# Patient Record
Sex: Female | Born: 1990 | Race: Black or African American | Hispanic: No | Marital: Single | Smoking: Never smoker
Health system: Southern US, Community
[De-identification: ages and names within clinical notes are randomized; demographics above are authoritative.]

## PROBLEM LIST (undated history)

## (undated) DIAGNOSIS — R87629 Unspecified abnormal cytological findings in specimens from vagina: Secondary | ICD-10-CM

## (undated) DIAGNOSIS — D649 Anemia, unspecified: Secondary | ICD-10-CM

## (undated) HISTORY — DX: Unspecified abnormal cytological findings in specimens from vagina: R87.629

---

## 2012-12-12 HISTORY — PX: CHOLECYSTECTOMY: SHX55

## 2015-12-24 ENCOUNTER — Ambulatory Visit: Payer: Self-pay | Admitting: Physician Assistant

## 2016-05-05 ENCOUNTER — Ambulatory Visit: Payer: Self-pay | Admitting: Gynecology

## 2017-04-26 ENCOUNTER — Encounter: Payer: Self-pay | Admitting: Gynecology

## 2017-10-16 DIAGNOSIS — G479 Sleep disorder, unspecified: Secondary | ICD-10-CM | POA: Diagnosis not present

## 2018-01-10 DIAGNOSIS — N760 Acute vaginitis: Secondary | ICD-10-CM | POA: Diagnosis not present

## 2018-01-10 DIAGNOSIS — B9689 Other specified bacterial agents as the cause of diseases classified elsewhere: Secondary | ICD-10-CM | POA: Diagnosis not present

## 2018-01-10 DIAGNOSIS — N390 Urinary tract infection, site not specified: Secondary | ICD-10-CM | POA: Diagnosis not present

## 2018-03-16 DIAGNOSIS — N898 Other specified noninflammatory disorders of vagina: Secondary | ICD-10-CM | POA: Diagnosis not present

## 2018-03-16 DIAGNOSIS — R0981 Nasal congestion: Secondary | ICD-10-CM | POA: Diagnosis not present

## 2018-03-16 DIAGNOSIS — J0100 Acute maxillary sinusitis, unspecified: Secondary | ICD-10-CM | POA: Diagnosis not present

## 2018-08-12 DIAGNOSIS — N390 Urinary tract infection, site not specified: Secondary | ICD-10-CM | POA: Diagnosis not present

## 2018-08-12 DIAGNOSIS — R319 Hematuria, unspecified: Secondary | ICD-10-CM | POA: Diagnosis not present

## 2018-08-12 DIAGNOSIS — R300 Dysuria: Secondary | ICD-10-CM | POA: Diagnosis not present

## 2018-08-12 DIAGNOSIS — R350 Frequency of micturition: Secondary | ICD-10-CM | POA: Diagnosis not present

## 2018-09-07 DIAGNOSIS — N390 Urinary tract infection, site not specified: Secondary | ICD-10-CM | POA: Diagnosis not present

## 2018-10-26 DIAGNOSIS — N390 Urinary tract infection, site not specified: Secondary | ICD-10-CM | POA: Diagnosis not present

## 2019-03-28 DIAGNOSIS — Z6841 Body Mass Index (BMI) 40.0 and over, adult: Secondary | ICD-10-CM | POA: Diagnosis not present

## 2019-03-28 DIAGNOSIS — N898 Other specified noninflammatory disorders of vagina: Secondary | ICD-10-CM | POA: Diagnosis not present

## 2019-04-03 DIAGNOSIS — N898 Other specified noninflammatory disorders of vagina: Secondary | ICD-10-CM | POA: Diagnosis not present

## 2019-04-23 DIAGNOSIS — Z309 Encounter for contraceptive management, unspecified: Secondary | ICD-10-CM | POA: Diagnosis not present

## 2019-04-23 DIAGNOSIS — Z1331 Encounter for screening for depression: Secondary | ICD-10-CM | POA: Diagnosis not present

## 2019-04-23 DIAGNOSIS — M26623 Arthralgia of bilateral temporomandibular joint: Secondary | ICD-10-CM | POA: Diagnosis not present

## 2019-06-14 DIAGNOSIS — K047 Periapical abscess without sinus: Secondary | ICD-10-CM | POA: Diagnosis not present

## 2019-06-19 DIAGNOSIS — Z6841 Body Mass Index (BMI) 40.0 and over, adult: Secondary | ICD-10-CM | POA: Diagnosis not present

## 2019-06-19 DIAGNOSIS — B373 Candidiasis of vulva and vagina: Secondary | ICD-10-CM | POA: Diagnosis not present

## 2019-06-27 DIAGNOSIS — N390 Urinary tract infection, site not specified: Secondary | ICD-10-CM | POA: Diagnosis not present

## 2019-06-27 DIAGNOSIS — Z6841 Body Mass Index (BMI) 40.0 and over, adult: Secondary | ICD-10-CM | POA: Diagnosis not present

## 2019-06-27 DIAGNOSIS — Z0000 Encounter for general adult medical examination without abnormal findings: Secondary | ICD-10-CM | POA: Diagnosis not present

## 2019-07-31 DIAGNOSIS — R87610 Atypical squamous cells of undetermined significance on cytologic smear of cervix (ASC-US): Secondary | ICD-10-CM | POA: Diagnosis not present

## 2019-07-31 DIAGNOSIS — N72 Inflammatory disease of cervix uteri: Secondary | ICD-10-CM | POA: Diagnosis not present

## 2019-07-31 DIAGNOSIS — N870 Mild cervical dysplasia: Secondary | ICD-10-CM | POA: Diagnosis not present

## 2019-07-31 DIAGNOSIS — R87810 Cervical high risk human papillomavirus (HPV) DNA test positive: Secondary | ICD-10-CM | POA: Diagnosis not present

## 2019-07-31 DIAGNOSIS — Z3202 Encounter for pregnancy test, result negative: Secondary | ICD-10-CM | POA: Diagnosis not present

## 2019-10-31 DIAGNOSIS — M26623 Arthralgia of bilateral temporomandibular joint: Secondary | ICD-10-CM | POA: Diagnosis not present

## 2019-10-31 DIAGNOSIS — K0889 Other specified disorders of teeth and supporting structures: Secondary | ICD-10-CM | POA: Diagnosis not present

## 2019-10-31 DIAGNOSIS — Z6841 Body Mass Index (BMI) 40.0 and over, adult: Secondary | ICD-10-CM | POA: Diagnosis not present

## 2019-11-12 DIAGNOSIS — Z20828 Contact with and (suspected) exposure to other viral communicable diseases: Secondary | ICD-10-CM | POA: Diagnosis not present

## 2019-11-12 DIAGNOSIS — Z7189 Other specified counseling: Secondary | ICD-10-CM | POA: Diagnosis not present

## 2019-11-15 DIAGNOSIS — Z20828 Contact with and (suspected) exposure to other viral communicable diseases: Secondary | ICD-10-CM | POA: Diagnosis not present

## 2019-11-29 DIAGNOSIS — Z113 Encounter for screening for infections with a predominantly sexual mode of transmission: Secondary | ICD-10-CM | POA: Diagnosis not present

## 2019-11-29 DIAGNOSIS — Z202 Contact with and (suspected) exposure to infections with a predominantly sexual mode of transmission: Secondary | ICD-10-CM | POA: Diagnosis not present

## 2019-11-29 DIAGNOSIS — Z6841 Body Mass Index (BMI) 40.0 and over, adult: Secondary | ICD-10-CM | POA: Diagnosis not present

## 2019-11-29 DIAGNOSIS — R87613 High grade squamous intraepithelial lesion on cytologic smear of cervix (HGSIL): Secondary | ICD-10-CM | POA: Diagnosis not present

## 2020-03-06 ENCOUNTER — Ambulatory Visit: Payer: Self-pay | Attending: Internal Medicine

## 2020-03-06 DIAGNOSIS — Z23 Encounter for immunization: Secondary | ICD-10-CM

## 2020-03-06 NOTE — Progress Notes (Signed)
   Covid-19 Vaccination Clinic  Name:  Megan Beasley    MRN: [id number] DOB: 13-Oct-1991  03/06/2020  [name] was observed post Covid-19 immunization for 15 minutes without incident. She was provided with Vaccine Information Sheet and instruction to access the V-Safe system.   [name] was instructed to call 911 with any severe reactions post vaccine: Marland Kitchen Difficulty breathing  . Swelling of face and throat  . A fast heartbeat  . A bad rash all over body  . Dizziness and weakness   Immunizations Administered    Name Date Dose VIS Date Route   Pfizer COVID-19 Vaccine 03/06/2020  8:30 AM 0.3 mL 11/22/2019 Intramuscular   Manufacturer: [employer], [employer]   Lot: [id number]   NDC: [address]-4

## 2020-03-30 ENCOUNTER — Ambulatory Visit: Payer: Self-pay | Attending: Internal Medicine

## 2020-03-30 DIAGNOSIS — Z23 Encounter for immunization: Secondary | ICD-10-CM

## 2020-03-30 NOTE — Progress Notes (Signed)
   Covid-19 Vaccination Clinic  Name:  Megan Beasley    MRN: [id number] DOB: Jul 20, 1991  03/30/2020  [name] was observed post Covid-19 immunization for 15 minutes without incident. She was provided with Vaccine Information Sheet and instruction to access the V-Safe system.   [name] was instructed to call 911 with any severe reactions post vaccine: Marland Kitchen Difficulty breathing  . Swelling of face and throat  . A fast heartbeat  . A bad rash all over body  . Dizziness and weakness   Immunizations Administered    Name Date Dose VIS Date Route   Pfizer COVID-19 Vaccine 03/30/2020  4:17 PM 0.3 mL 02/05/2019 Intramuscular   Manufacturer: [employer], [employer]   Lot: [id number]   NDC: [address]-1

## 2020-05-13 DIAGNOSIS — R6884 Jaw pain: Secondary | ICD-10-CM | POA: Diagnosis not present

## 2020-05-13 DIAGNOSIS — M26629 Arthralgia of temporomandibular joint, unspecified side: Secondary | ICD-10-CM | POA: Diagnosis not present

## 2020-05-13 DIAGNOSIS — Z9049 Acquired absence of other specified parts of digestive tract: Secondary | ICD-10-CM | POA: Diagnosis not present

## 2020-12-12 NOTE — L&D Delivery Note (Addendum)
Delivery Note At 5:13 PM a viable and healthy female was delivered via Vaginal, Spontaneous (Presentation: Right Occiput Anterior).  APGAR: 9, 9; weight pending .   Placenta status: Spontaneous, Intact.  Cord: 3 vessels with loose nuchal cord x 1  The patient pushed with 1 contraction and delivered a vigorous female infant in the vertex right occiput anterior presentation with Apgar scores of 9 at 1 minute and 9 at 5 minutes.  With delivery of the infant's head a loose nuchal cord was noted and reduced on the perineum prior to delivery of the body.  The infant was passed to the waiting maternal abdomen.  Following a 1 minute delay, the cord was clamped and cut.  The placenta delivered spontaneously, intact, with three-vessel cord.  No lacerations required repair.  Following delivery of the placenta the patient had some intermittent uterine atony that resolved with bimanual massage.  1000 mcg of misoprostol were placed rectally.  EBL 157 cc.  Mom and baby are doing well following delivery.  Anesthesia: Epidural Episiotomy: None Lacerations: None Suture Repair:  NA Est. Blood Loss (mL): 157  Mom to postpartum.  Baby to Couplet care / Skin to Skin.  Waynard Reeds 10/22/2021, 5:47 PM

## 2021-03-17 ENCOUNTER — Encounter: Payer: Self-pay | Admitting: Obstetrics & Gynecology

## 2021-04-15 LAB — OB RESULTS CONSOLE ABO/RH: RH Type: POSITIVE

## 2021-04-15 LAB — OB RESULTS CONSOLE GC/CHLAMYDIA
Chlamydia: NEGATIVE
Gonorrhea: NEGATIVE

## 2021-04-15 LAB — OB RESULTS CONSOLE HEPATITIS B SURFACE ANTIGEN: Hepatitis B Surface Ag: NEGATIVE

## 2021-04-15 LAB — OB RESULTS CONSOLE RUBELLA ANTIBODY, IGM: Rubella: IMMUNE

## 2021-04-15 LAB — OB RESULTS CONSOLE ANTIBODY SCREEN: Antibody Screen: NEGATIVE

## 2021-04-15 LAB — OB RESULTS CONSOLE RPR: RPR: NONREACTIVE

## 2021-04-15 LAB — OB RESULTS CONSOLE HIV ANTIBODY (ROUTINE TESTING): HIV: NONREACTIVE

## 2021-05-01 ENCOUNTER — Other Ambulatory Visit: Payer: Self-pay | Admitting: Obstetrics & Gynecology

## 2021-05-14 ENCOUNTER — Other Ambulatory Visit: Payer: Self-pay | Admitting: Obstetrics & Gynecology

## 2021-05-14 ENCOUNTER — Telehealth: Payer: Self-pay

## 2021-05-14 DIAGNOSIS — O99212 Obesity complicating pregnancy, second trimester: Secondary | ICD-10-CM

## 2021-05-14 NOTE — Telephone Encounter (Signed)
Mailed patient her GFE with a new patient letter and map.  

## 2021-06-01 ENCOUNTER — Encounter: Payer: Self-pay | Admitting: *Deleted

## 2021-06-07 ENCOUNTER — Other Ambulatory Visit: Payer: Self-pay | Admitting: *Deleted

## 2021-06-07 ENCOUNTER — Other Ambulatory Visit: Payer: Self-pay

## 2021-06-07 ENCOUNTER — Ambulatory Visit: Payer: 59 | Admitting: *Deleted

## 2021-06-07 ENCOUNTER — Ambulatory Visit: Payer: 59 | Attending: Obstetrics & Gynecology

## 2021-06-07 ENCOUNTER — Encounter: Payer: Self-pay | Admitting: *Deleted

## 2021-06-07 DIAGNOSIS — Z6841 Body Mass Index (BMI) 40.0 and over, adult: Secondary | ICD-10-CM

## 2021-06-07 DIAGNOSIS — O99212 Obesity complicating pregnancy, second trimester: Secondary | ICD-10-CM | POA: Diagnosis not present

## 2021-06-07 DIAGNOSIS — O3482 Maternal care for other abnormalities of pelvic organs, second trimester: Secondary | ICD-10-CM | POA: Diagnosis not present

## 2021-06-07 DIAGNOSIS — Z3A18 18 weeks gestation of pregnancy: Secondary | ICD-10-CM

## 2021-06-07 DIAGNOSIS — Z862 Personal history of diseases of the blood and blood-forming organs and certain disorders involving the immune mechanism: Secondary | ICD-10-CM

## 2021-06-07 DIAGNOSIS — N879 Dysplasia of cervix uteri, unspecified: Secondary | ICD-10-CM

## 2021-06-07 DIAGNOSIS — Z363 Encounter for antenatal screening for malformations: Secondary | ICD-10-CM

## 2021-06-07 IMAGING — US US MFM OB DETAIL+14 WK
1 series · 11 of 28 positions shown · non-contrast
Comparison: none

[Series 1: us mfm ob detail+14 wk · 11 of 129 slices shown]
[im 5/129]
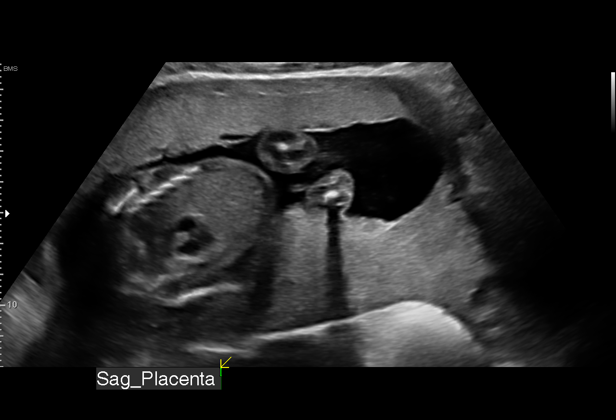
[im 15/129]
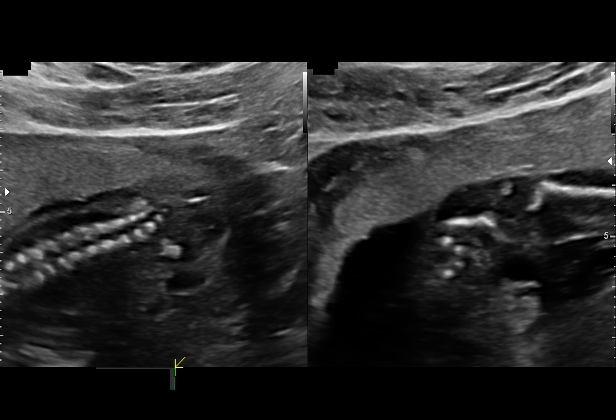
[im 29/129]
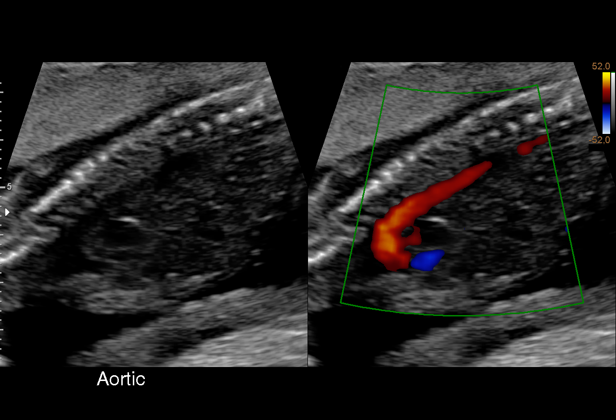
[im 38/129]
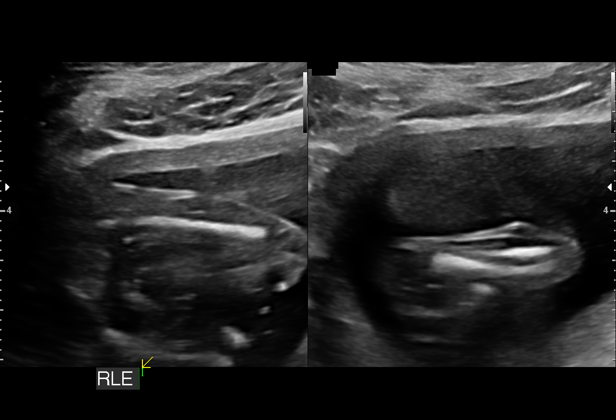
[im 53/129]
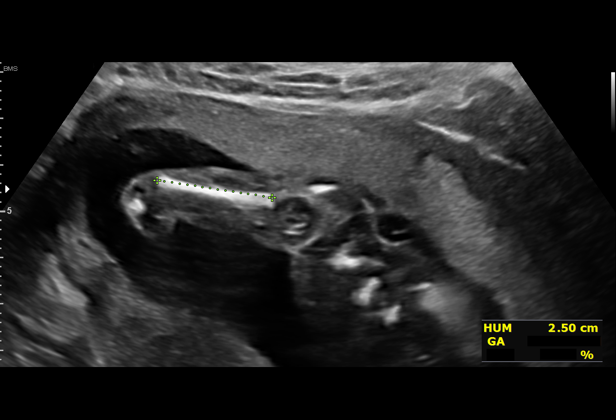
[im 67/129]
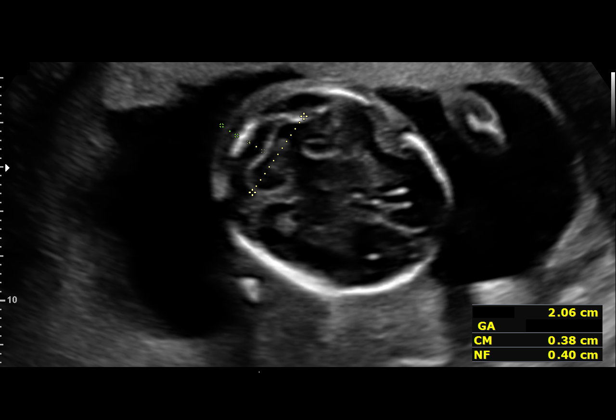
[im 76/129]
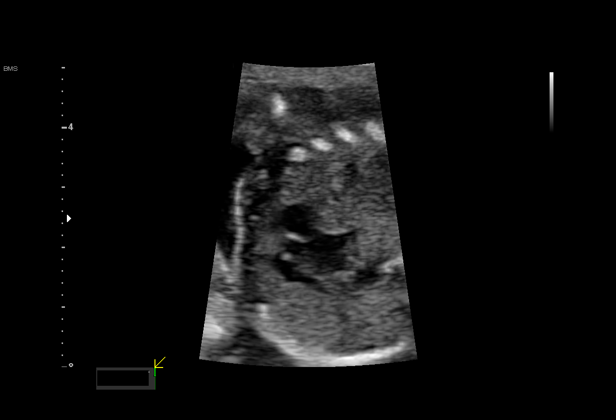
[im 91/129]
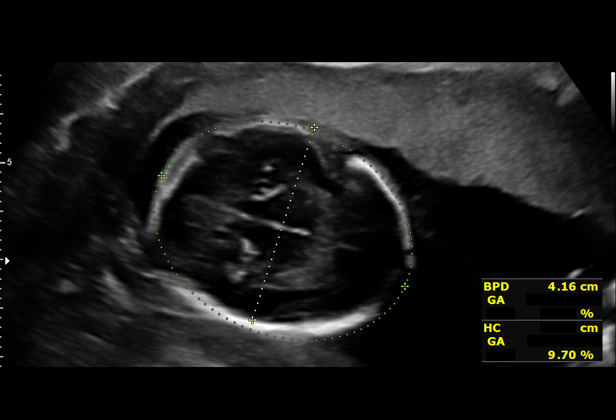
[im 100/129]
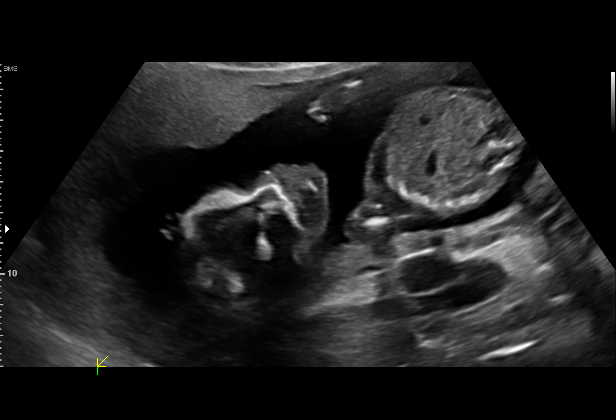
[im 114/129]
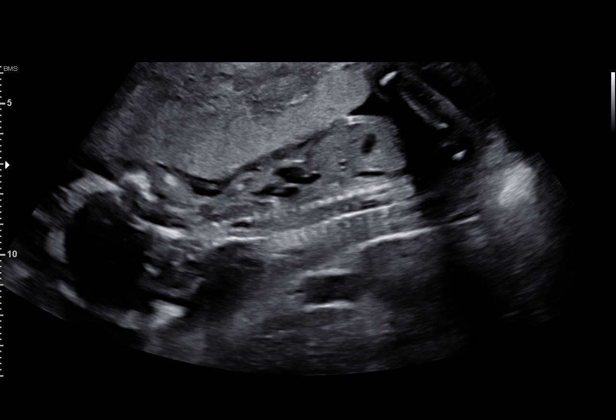
[im 124/129]
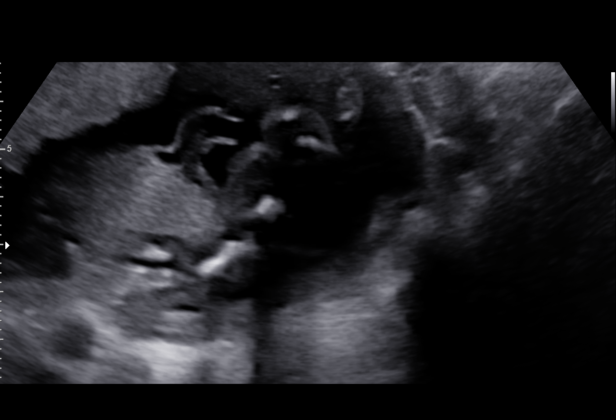

[11 of 28 positions shown; findings below may reference images not displayed]

Addendum:\.br----------------------------------------------------------------------
----------------------------------------------------------------------

----------------------------------------------------------------------

                                                            OBGYN
                   [facility]
----------------------------------------------------------------------

                                                      [facility]
----------------------------------------------------------------------

----------------------------------------------------------------------
Indications

 Obesity complicating pregnancy, second
 trimester (BMI 61)
 Encounter for antenatal screening for
 malformations
 LR NIPS
 History of sickle cell trait
 Medical complication of pregnancy
 (Abnormal PAP, HPV)
 19 weeks gestation of pregnancy
----------------------------------------------------------------------
Fetal Evaluation

 Num Of Fetuses:         1
 Fetal Heart Rate(bpm):  155
 Cardiac Activity:       Observed
 Presentation:           Cephalic
 Placenta:               Anterior
 P. Cord Insertion:      Visualized, central

 Amniotic Fluid
 AFI FV:      Within normal limits

                             Largest Pocket(cm)

----------------------------------------------------------------------
Biometry

 BPD:      42.7  mm     G. Age:  18w 6d         34  %    CI:        72.43   %    70 - 86
                                                         FL/HC:      17.2   %    16.1 -
 HC:      159.6  mm     G. Age:  18w 6d         21  %    HC/AC:      1.24        1.09 -
 AC:      128.3  mm     G. Age:  18w 3d         18  %    FL/BPD:     64.4   %
 FL:       27.5  mm     G. Age:  18w 3d         15  %    FL/AC:      21.4   %    20 - 24
 HUM:      25.7  mm     G. Age:  18w 0d         18  %
 CER:      20.6  mm     G. Age:  19w 5d         68  %
 NFT:       4.0  mm
 LV:        6.6  mm
 CM:        3.8  mm

 Est. FW:     241  gm      0 lb 9 oz     10  %
----------------------------------------------------------------------
OB History

 Gravidity:    2
 Living:       1
----------------------------------------------------------------------
Gestational Age

 LMP:           19w 2d        Date:  01/23/21                 EDD:   10/30/21
 U/S Today:     18w 5d                                        EDD:   11/03/21
 Best:          19w 2d     Det. By:  LMP  (01/23/21)          EDD:   10/30/21
----------------------------------------------------------------------
Anatomy

 Cranium:               Appears normal         Aortic Arch:            Appears normal
 Cavum:                 Appears normal         Ductal Arch:            Appears normal
 Ventricles:            Appears normal         Diaphragm:              Appears normal
 Choroid Plexus:        Appears normal         Stomach:                Appears normal, left
                                                                       sided
 Cerebellum:            Appears normal         Abdomen:                Appears normal
 Posterior Fossa:       Appears normal         Abdominal Wall:         Appears nml (cord
                                                                       insert, abd wall)
 Nuchal Fold:           Appears normal         Cord Vessels:           Appears normal (3
                                                                       vessel cord)
 Face:                  Orbits nl; profile not Kidneys:                Appear normal
                        well visualized
 Lips:                  Appears normal         Bladder:                Appears normal
 Thoracic:              Appears normal         Spine:                  Appears normal
 Heart:                 Not well visualized    Upper Extremities:      Appears normal
 RVOT:                  Appears normal         Lower Extremities:      Appears normal
 LVOT:                  Appears normal

 Other:  Heels/feet and open hands/5th digits visualized. Fetus appears to be
         female. Technically difficult due to maternal habitus and fetal position.
----------------------------------------------------------------------
Doppler - Fetal Vessels

 Umbilical Artery
  S/D     %tile      RI    %tile      PI    %tile            ADFV    RDFV
  5.27       82    0.81       76    1.51       84               No      No

----------------------------------------------------------------------
Cervix Uterus Adnexa
 Cervix
 Length:           3.98  cm.
 Normal appearance by transabdominal scan.

 Uterus
 No abnormality visualized.

 Right Ovary
 Not visualized.

 Left Ovary
 Not visualized.

 Cul De Sac
 No free fluid seen.

 Adnexa
 No adnexal mass visualized.
----------------------------------------------------------------------
Comments

 This patient was seen for a detailed fetal anatomy scan due
 to maternal obesity with a BMI of 61.4.
 She denies any significant past medical history and denies
 any problems in her current pregnancy.
 She had a cell free DNA test earlier in her pregnancy which
 indicated a low risk for trisomy 21, 18, and 13. A female fetus
 is predicted.
 She was informed that the fetal growth and amniotic fluid
 level were appropriate for her gestational age.  The fetal
 biometry measurements obtained today confirms an EDC November 04, 2021.
 There were no obvious fetal anomalies noted on today's
 ultrasound exam.  However, today's exam was limited due to
 extreme maternal body habitus and the fetal position.
 The patient was informed that anomalies may be missed due
 to technical limitations. If the fetus is in a suboptimal position
 or maternal habitus is increased, visualization of the fetus in
 the maternal uterus may be impaired.
 Due to maternal obesity, we will continue to follow her with
 growth ultrasounds throughout her pregnancy.  Weekly fetal
 testing should be started at around 36 weeks.
 A follow-up exam was scheduled in 4 weeks to assess the
 fetal growth and to complete the views of the fetal anatomy.
 ADDENDUM: 07/07/2021
 I received a call from Dr. Mivis indicating that the patient's
 EDC should probably be October 30, 2021 based on her
 LMP.  She had a first trimester ultrasound that indicated an
 EDC November 04, 2021.  As the EDC based on her first
 trimester ultrasound was within 5 days of that based on her
 LMP, we will keep her EDC as October 30, 2021.
----------------------------------------------------------------------
----------------------------------------------------------------------

*** End of Addendum ***\.br----------------------------------------------------------------------
----------------------------------------------------------------------

----------------------------------------------------------------------

                                                            OBGYN
                   [facility]
----------------------------------------------------------------------

                                                      [facility]
----------------------------------------------------------------------

----------------------------------------------------------------------
Indications

 Obesity complicating pregnancy, second
 trimester (BMI 61)
 Encounter for antenatal screening for
 malformations
 LR NIPS
 History of sickle cell trait
 Medical complication of pregnancy
 (Abnormal PAP, HPV)
 18 weeks gestation of pregnancy
----------------------------------------------------------------------
Fetal Evaluation

 Num Of Fetuses:         1
 Fetal Heart Rate(bpm):  155
 Cardiac Activity:       Observed
 Presentation:           Cephalic
 Placenta:               Anterior
 P. Cord Insertion:      Visualized, central

 Amniotic Fluid
 AFI FV:      Within normal limits

                             Largest Pocket(cm)

----------------------------------------------------------------------
Biometry

 BPD:      42.7  mm     G. Age:  18w 6d         66  %    CI:        72.43   %    70 - 86
                                                         FL/HC:      17.2   %    16.1 -
 HC:      159.6  mm     G. Age:  18w 6d         54  %    HC/AC:      1.24        1.09 -
 AC:      128.3  mm     G. Age:  18w 3d         40  %    FL/BPD:     64.4   %
 FL:       27.5  mm     G. Age:  18w 3d         37  %    FL/AC:      21.4   %    20 - 24
 HUM:      25.7  mm     G. Age:  18w 0d         39  %
 CER:      20.6  mm     G. Age:  19w 5d         90  %
 NFT:       4.0  mm
 LV:        6.6  mm
 CM:        3.8  mm

 Est. FW:     241  gm      0 lb 9 oz     38  %
----------------------------------------------------------------------
OB History

 Gravidity:    2
 Living:       1
----------------------------------------------------------------------
Gestational Age

 LMP:           19w 2d        Date:  01/23/21                 EDD:   10/30/21
 U/S Today:     18w 5d                                        EDD:   11/03/21
 Best:          18w 4d     Det. By:  Early Ultrasound         EDD:   11/04/21
                                     (03/17/21)
----------------------------------------------------------------------
Anatomy

 Cranium:               Appears normal         Aortic Arch:            Appears normal
 Cavum:                 Appears normal         Ductal Arch:            Appears normal
 Ventricles:            Appears normal         Diaphragm:              Appears normal
 Choroid Plexus:        Appears normal         Stomach:                Appears normal, left
                                                                       sided
 Cerebellum:            Appears normal         Abdomen:                Appears normal
 Posterior Fossa:       Appears normal         Abdominal Wall:         Appears nml (cord
                                                                       insert, abd wall)
 Nuchal Fold:           Appears normal         Cord Vessels:           Appears normal (3
                                                                       vessel cord)
 Face:                  Orbits nl; profile not Kidneys:                Appear normal
                        well visualized
 Lips:                  Appears normal         Bladder:                Appears normal
 Thoracic:              Appears normal         Spine:                  Appears normal
 Heart:                 Not well visualized    Upper Extremities:      Appears normal
 RVOT:                  Appears normal         Lower Extremities:      Appears normal
 LVOT:                  Appears normal

 Other:  Heels/feet and open hands/5th digits visualized. Fetus appears to be
         female. Technically difficult due to maternal habitus and fetal position.
----------------------------------------------------------------------
Doppler - Fetal Vessels

 Umbilical Artery
  S/D                RI               PI                     ADFV    RDFV
  5.27             0.81             1.51                        No      No

----------------------------------------------------------------------
Cervix Uterus Adnexa
 Cervix
 Length:           3.98  cm.
 Normal appearance by transabdominal scan.

 Uterus
 No abnormality visualized.

 Right Ovary
 Not visualized.

 Left Ovary
 Not visualized.

 Cul De Sac
 No free fluid seen.

 Adnexa
 No adnexal mass visualized.
----------------------------------------------------------------------
Comments

 This patient was seen for a detailed fetal anatomy scan due
 to maternal obesity with a BMI of 61.4.
 She denies any significant past medical history and denies
 any problems in her current pregnancy.
 She had a cell free DNA test earlier in her pregnancy which
 indicated a low risk for trisomy 21, 18, and 13. A female fetus
 is predicted.
 She was informed that the fetal growth and amniotic fluid
 level were appropriate for her gestational age.  The fetal
 biometry measurements obtained today confirms an EDC November 04, 2021.
 There were no obvious fetal anomalies noted on today's
 ultrasound exam.  However, today's exam was limited due to
 extreme maternal body habitus and the fetal position.
 The patient was informed that anomalies may be missed due
 to technical limitations. If the fetus is in a suboptimal position
 or maternal habitus is increased, visualization of the fetus in
 the maternal uterus may be impaired.
 Due to maternal obesity, we will continue to follow her with
 growth ultrasounds throughout her pregnancy.  Weekly fetal
 testing should be started at around 36 weeks.
 A follow-up exam was scheduled in 4 weeks to assess the
 fetal growth and to complete the views of the fetal anatomy.
----------------------------------------------------------------------
----------------------------------------------------------------------

## 2021-06-07 NOTE — Progress Notes (Unsigned)
[facility] changer

## 2021-07-07 ENCOUNTER — Ambulatory Visit: Payer: 59 | Attending: Obstetrics

## 2021-07-07 ENCOUNTER — Encounter: Payer: Self-pay | Admitting: *Deleted

## 2021-07-07 ENCOUNTER — Ambulatory Visit: Payer: 59 | Admitting: *Deleted

## 2021-07-07 ENCOUNTER — Other Ambulatory Visit: Payer: Self-pay

## 2021-07-07 ENCOUNTER — Other Ambulatory Visit: Payer: Self-pay | Admitting: *Deleted

## 2021-07-07 VITALS — BP 134/71 | HR 91

## 2021-07-07 DIAGNOSIS — Z3A23 23 weeks gestation of pregnancy: Secondary | ICD-10-CM | POA: Insufficient documentation

## 2021-07-07 DIAGNOSIS — Z362 Encounter for other antenatal screening follow-up: Secondary | ICD-10-CM | POA: Insufficient documentation

## 2021-07-07 DIAGNOSIS — O99212 Obesity complicating pregnancy, second trimester: Secondary | ICD-10-CM | POA: Insufficient documentation

## 2021-07-07 DIAGNOSIS — Z6841 Body Mass Index (BMI) 40.0 and over, adult: Secondary | ICD-10-CM

## 2021-07-07 DIAGNOSIS — O3442 Maternal care for other abnormalities of cervix, second trimester: Secondary | ICD-10-CM

## 2021-07-07 DIAGNOSIS — N879 Dysplasia of cervix uteri, unspecified: Secondary | ICD-10-CM

## 2021-07-07 DIAGNOSIS — Z862 Personal history of diseases of the blood and blood-forming organs and certain disorders involving the immune mechanism: Secondary | ICD-10-CM

## 2021-07-07 DIAGNOSIS — R638 Other symptoms and signs concerning food and fluid intake: Secondary | ICD-10-CM

## 2021-07-07 IMAGING — US US MFM OB FOLLOW-UP
1 series · 13 of 28 positions shown · non-contrast
Comparison: none

[Series 1: us mfm ob follow-up · 13 of 104 slices shown]
[im 4/104]
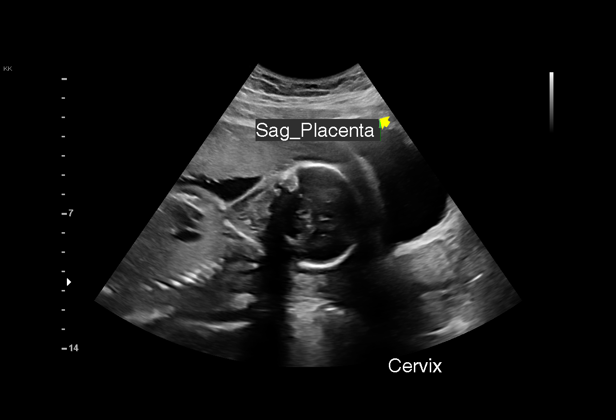
[im 12/104]
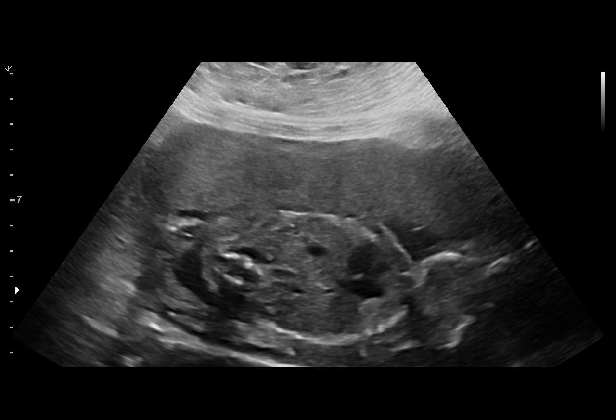
[im 20/104]
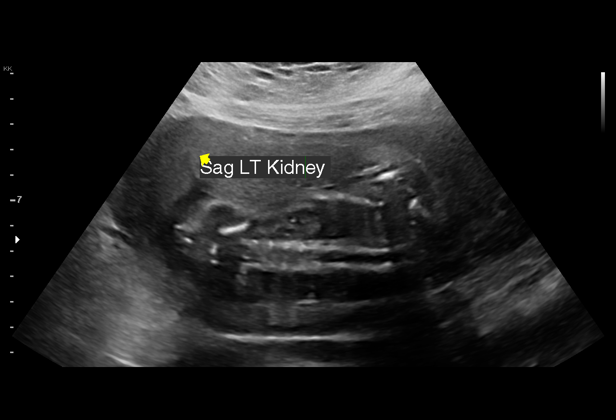
[im 27/104]
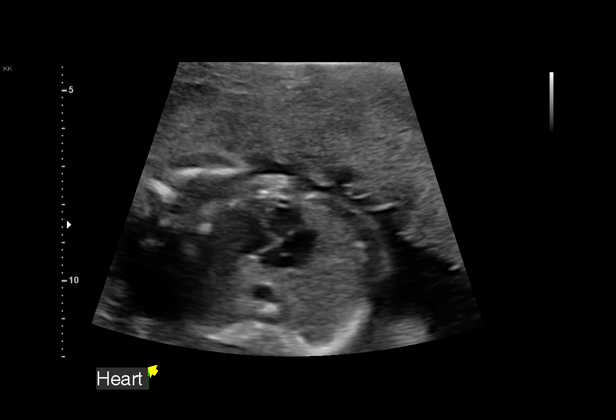
[im 35/104]
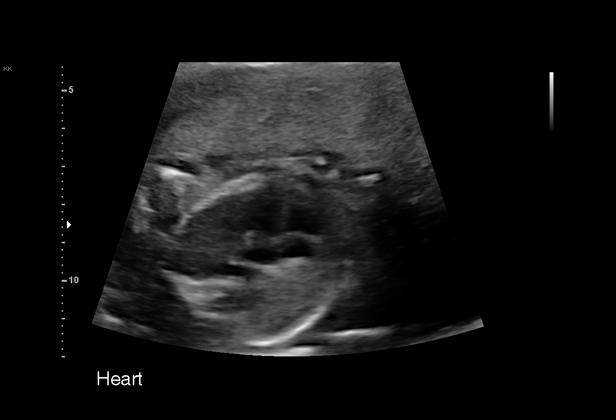
[im 42/104]
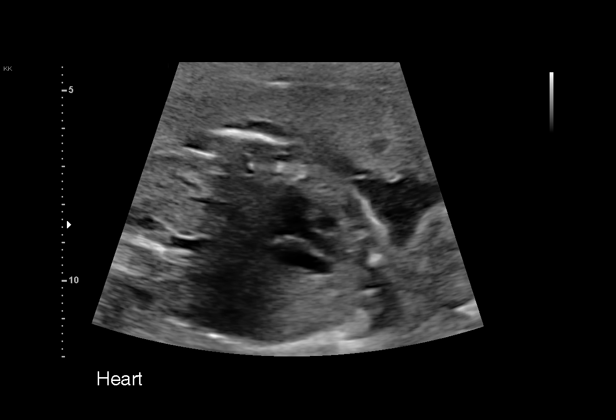
[im 54/104]
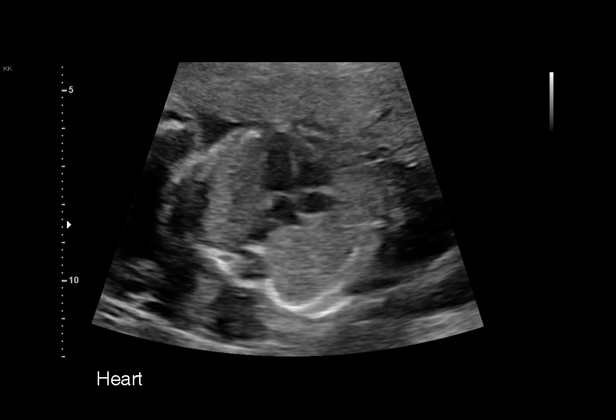
[im 62/104]
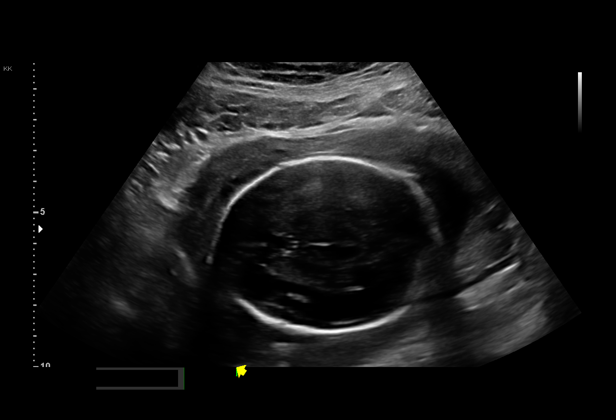
[im 69/104]
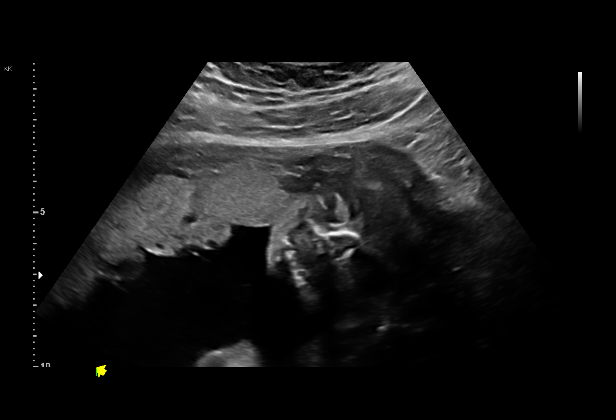
[im 77/104]
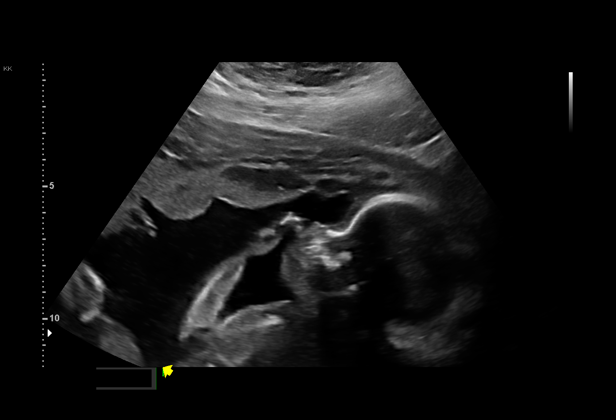
[im 84/104]
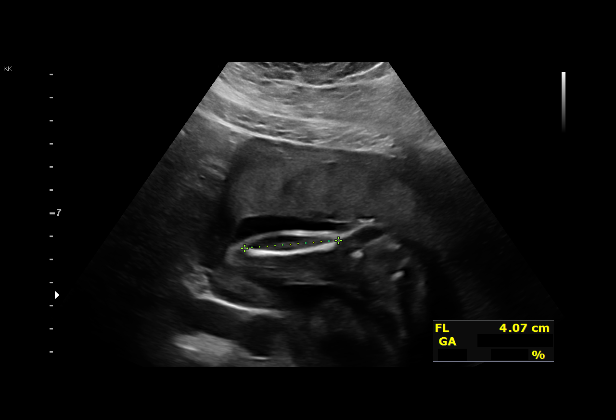
[im 92/104]
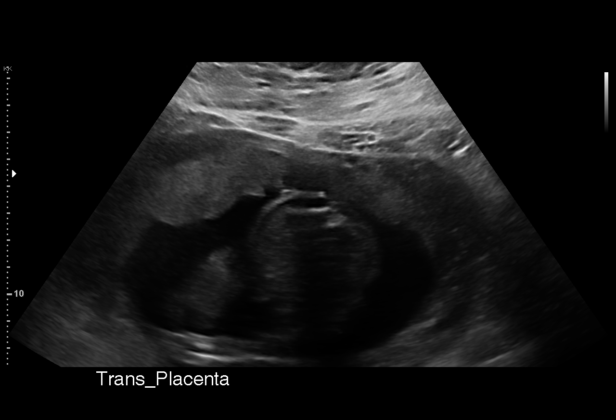
[im 100/104]
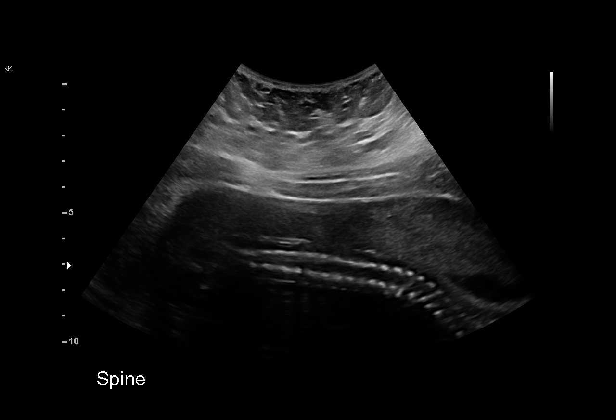

[13 of 28 positions shown; findings below may reference images not displayed]

Addendum:\.br----------------------------------------------------------------------
----------------------------------------------------------------------

----------------------------------------------------------------------

                                                            OBGYN
                   LIENAD
----------------------------------------------------------------------

----------------------------------------------------------------------

----------------------------------------------------------------------
Indications

 Obesity complicating pregnancy, second
 trimester (BMI 61)
 LR NIPS
 History of sickle cell trait
 Medical complication of pregnancy
 (Abnormal PAP, HPV)
 23 weeks gestation of pregnancy
 Antenatal follow-up for nonvisualized fetal
 anatomy
----------------------------------------------------------------------
Fetal Evaluation

 Num Of Fetuses:         1
 Fetal Heart Rate(bpm):  144
 Cardiac Activity:       Observed
 Presentation:           Cephalic
 Placenta:               Anterior
 P. Cord Insertion:      Visualized

 Amniotic Fluid
 AFI FV:      Within normal limits

                             Largest Pocket(cm)

----------------------------------------------------------------------
Biometry
 BPD:      56.2  mm     G. Age:  23w 1d         29  %    CI:        73.43   %    70 - 86
                                                         FL/HC:      19.7   %    18.7 -
 HC:      208.4  mm     G. Age:  23w 0d         13  %    HC/AC:      1.14        1.05 -
 AC:      183.2  mm     G. Age:  23w 1d         28  %    FL/BPD:     73.1   %    71 - 87
 FL:       41.1  mm     G. Age:  23w 2d         30  %    FL/AC:      22.4   %    20 - 24
 CER:      26.4  mm     G. Age:  23w 5d         74  %
 CM:        5.5  mm

 Est. FW:     572  gm      1 lb 4 oz     25  %
----------------------------------------------------------------------
OB History

 Gravidity:    2
 Living:       1
----------------------------------------------------------------------
Gestational Age

 LMP:           23w 4d        Date:  01/23/21                 EDD:   10/30/21
 U/S Today:     23w 1d                                        EDD:   11/02/21
 Best:          23w 4d     Det. By:  LMP  (01/23/21)          EDD:   10/30/21
----------------------------------------------------------------------
Anatomy

 Cranium:               Appears normal         Aortic Arch:            Previously seen
 Cavum:                 Appears normal         Ductal Arch:            Appears normal
 Ventricles:            Appears normal         Diaphragm:              Appears normal
 Choroid Plexus:        Previously seen        Stomach:                Appears normal, left
                                                                       sided
 Cerebellum:            Appears normal         Abdomen:                Appears normal
 Posterior Fossa:       Appears normal         Abdominal Wall:         Appears nml (cord
                                                                       insert, abd wall)
 Nuchal Fold:           Previously seen        Cord Vessels:           Appears normal (3
                                                                       vessel cord)
 Face:                  Appears normal         Kidneys:                Appear normal
                        (orbits and profile)
 Lips:                  Appears normal         Bladder:                Appears normal
 Thoracic:              Appears normal         Spine:                  Appears normal
 Heart:                 Appears normal         Upper Extremities:      Previously seen
                        (4CH, axis, and
                        situs)
 RVOT:                  Appears normal         Lower Extremities:      Previously seen
 LVOT:                  Appears normal

 Other:  Heels/feet and open hands/5th digits previously visualized. Fetus
         appears to be female. Technically difficult due to maternal habitus
         and fetal position.
----------------------------------------------------------------------
Cervix Uterus Adnexa

 Cervix
 Length:           4.11  cm.
 Normal appearance by transabdominal scan.
----------------------------------------------------------------------
Impression

 Follow up growth due to elevated maternal BMI and to
 complete the fetal anatomy.
 Normal interval growth with measurements consistent with
 dates (she is dated by LMP of [DATE] per request of Dr. Otoole)
 Good fetal movement and amniotic fluid volume

 Ms. Johal had a blood pressure 144/83 and 134/71 mmHg.
 She had a normal 1hr GTT.
----------------------------------------------------------------------
Recommendations

 Follow up growth was scheduled in 4-6 weeks given elevated
 BMI
 Initiate weekly testing at 36 weeks.
----------------------------------------------------------------------
----------------------------------------------------------------------

*** End of Addendum ***\.br----------------------------------------------------------------------
----------------------------------------------------------------------

----------------------------------------------------------------------

                                                            OBGYN
                   LIENAD
----------------------------------------------------------------------

----------------------------------------------------------------------

----------------------------------------------------------------------
Indications

 Obesity complicating pregnancy, second
 trimester (BMI 61)
 LR NIPS
 History of sickle cell trait
 Medical complication of pregnancy
 (Abnormal PAP, HPV)
 22 weeks gestation of pregnancy
 Antenatal follow-up for nonvisualized fetal
 anatomy
----------------------------------------------------------------------
Fetal Evaluation

 Num Of Fetuses:         1
 Fetal Heart Rate(bpm):  144
 Cardiac Activity:       Observed
 Presentation:           Cephalic
 Placenta:               Anterior
 P. Cord Insertion:      Visualized

 Amniotic Fluid
 AFI FV:      Within normal limits

                             Largest Pocket(cm)

----------------------------------------------------------------------
Biometry
 BPD:      56.2  mm     G. Age:  23w 1d         58  %    CI:        73.43   %    70 - 86
                                                         FL/HC:      19.7   %    19.2 -
 HC:      208.4  mm     G. Age:  23w 0d         38  %    HC/AC:      1.14        1.05 -
 AC:      183.2  mm     G. Age:  23w 1d         51  %    FL/BPD:     73.1   %    71 - 87
 FL:       41.1  mm     G. Age:  23w 2d         55  %    FL/AC:      22.4   %    20 - 24
 CER:      26.4  mm     G. Age:  23w 5d         94  %
 CM:        5.5  mm

 Est. FW:     572  gm      1 lb 4 oz     60  %
----------------------------------------------------------------------
OB History

 Gravidity:    2
 Living:       1
----------------------------------------------------------------------
Gestational Age

 LMP:           23w 4d        Date:  01/23/21                 EDD:   10/30/21
 U/S Today:     23w 1d                                        EDD:   11/02/21
 Best:          22w 6d     Det. By:  Early Ultrasound         EDD:   11/04/21
                                     (03/17/21)
----------------------------------------------------------------------
Anatomy

 Cranium:               Appears normal         Aortic Arch:            Previously seen
 Cavum:                 Appears normal         Ductal Arch:            Appears normal
 Ventricles:            Appears normal         Diaphragm:              Appears normal
 Choroid Plexus:        Previously seen        Stomach:                Appears normal, left
                                                                       sided
 Cerebellum:            Appears normal         Abdomen:                Appears normal
 Posterior Fossa:       Appears normal         Abdominal Wall:         Appears nml (cord
                                                                       insert, abd wall)
 Nuchal Fold:           Previously seen        Cord Vessels:           Appears normal (3
                                                                       vessel cord)
 Face:                  Appears normal         Kidneys:                Appear normal
                        (orbits and profile)
 Lips:                  Appears normal         Bladder:                Appears normal
 Thoracic:              Appears normal         Spine:                  Appears normal
 Heart:                 Appears normal         Upper Extremities:      Previously seen
                        (4CH, axis, and
                        situs)
 RVOT:                  Appears normal         Lower Extremities:      Previously seen
 LVOT:                  Appears normal

 Other:  Heels/feet and open hands/5th digits previously visualized. Fetus
         appears to be female. Technically difficult due to maternal habitus
         and fetal position.
----------------------------------------------------------------------
Cervix Uterus Adnexa

 Cervix
 Length:           4.11  cm.
 Normal appearance by transabdominal scan.
----------------------------------------------------------------------
Impression

 Follow up growth due to elevated maternal BMI and to
 complete the fetal anatomy.
 Normal interval growth with measurements consistent with
 dates
 Good fetal movement and amniotic fluid volume

 Ms. Johal had a blood pressure 144/83 and 134/71 mmHg.
 She had a normal 1hr GTT.
----------------------------------------------------------------------
Recommendations

 Follow up growth was scheduled in 4-6 weeks given elevated
 BMI
 Initiate weekly testing at 36 weeks.
----------------------------------------------------------------------
----------------------------------------------------------------------

## 2021-07-07 NOTE — Progress Notes (Signed)
Dr. Booker aware of elevated BP 

## 2021-08-04 ENCOUNTER — Ambulatory Visit: Payer: 59

## 2021-08-04 ENCOUNTER — Ambulatory Visit: Payer: 59 | Attending: Maternal & Fetal Medicine

## 2021-08-20 ENCOUNTER — Observation Stay (HOSPITAL_COMMUNITY)
Admission: AD | Admit: 2021-08-20 | Discharge: 2021-08-21 | Disposition: A | Discharge status: Home / Self Care | Payer: 59 | Attending: Obstetrics and Gynecology | Admitting: Obstetrics and Gynecology

## 2021-08-20 ENCOUNTER — Other Ambulatory Visit: Payer: Self-pay

## 2021-08-20 DIAGNOSIS — O10012 Pre-existing essential hypertension complicating pregnancy, second trimester: Secondary | ICD-10-CM | POA: Diagnosis not present

## 2021-08-20 DIAGNOSIS — Z3A29 29 weeks gestation of pregnancy: Secondary | ICD-10-CM | POA: Diagnosis not present

## 2021-08-20 DIAGNOSIS — Z20822 Contact with and (suspected) exposure to covid-19: Secondary | ICD-10-CM | POA: Insufficient documentation

## 2021-08-20 DIAGNOSIS — O219 Vomiting of pregnancy, unspecified: Secondary | ICD-10-CM | POA: Diagnosis not present

## 2021-08-20 DIAGNOSIS — O4190X0 Disorder of amniotic fluid and membranes, unspecified, unspecified trimester, not applicable or unspecified: Secondary | ICD-10-CM

## 2021-08-20 DIAGNOSIS — O365990 Maternal care for other known or suspected poor fetal growth, unspecified trimester, not applicable or unspecified: Secondary | ICD-10-CM | POA: Diagnosis present

## 2021-08-20 LAB — COMPREHENSIVE METABOLIC PANEL
ALT: 12 U/L (ref 0–44)
AST: 15 U/L (ref 15–41)
Albumin: 2.9 g/dL — ABNORMAL LOW (ref 3.5–5.0)
Alkaline Phosphatase: 50 U/L (ref 38–126)
Anion gap: 10 (ref 5–15)
BUN: 6 mg/dL (ref 6–20)
CO2: 20 mmol/L — ABNORMAL LOW (ref 22–32)
Calcium: 9.4 mg/dL (ref 8.9–10.3)
Chloride: 103 mmol/L (ref 98–111)
Creatinine, Ser: 0.8 mg/dL (ref 0.44–1.00)
GFR, Estimated: >60 mL/min (ref >60)
Glucose, Bld: 91 mg/dL (ref 70–99)
Potassium: 3.6 mmol/L (ref 3.5–5.1)
Sodium: 133 mmol/L — ABNORMAL LOW (ref 135–145)
Total Bilirubin: 0.9 mg/dL (ref 0.3–1.2)
Total Protein: 6.7 g/dL (ref 6.5–8.1)

## 2021-08-20 LAB — CBC
HCT: 33.7 % — ABNORMAL LOW (ref 36.0–46.0)
Hemoglobin: 11.5 g/dL — ABNORMAL LOW (ref 12.0–15.0)
MCH: 29.4 pg (ref 26.0–34.0)
MCHC: 34.1 g/dL (ref 30.0–36.0)
MCV: 86.2 fL (ref 80.0–100.0)
Platelets: 233 10*3/uL (ref 150–400)
RBC: 3.91 MIL/uL (ref 3.87–5.11)
RDW: 14.2 % (ref 11.5–15.5)
WBC: 7.2 10*3/uL (ref 4.0–10.5)
nRBC: 0 % (ref 0.0–0.2)

## 2021-08-20 LAB — TYPE AND SCREEN
ABO/RH(D): A POS
Antibody Screen: NEGATIVE

## 2021-08-20 LAB — SARS CORONAVIRUS 2 (TAT 6-24 HRS): SARS Coronavirus 2: NEGATIVE

## 2021-08-20 MED ORDER — ZOLPIDEM TARTRATE 5 MG PO TABS: 5.0000 mg | ORAL_TABLET | Freq: Every evening | ORAL | Status: DC | PRN

## 2021-08-20 MED ORDER — LACTATED RINGERS IV SOLN
INTRAVENOUS | Status: DC
  Administered 2021-08-20 – 2021-08-21 (×2): 125 mL/h via INTRAVENOUS

## 2021-08-20 MED ORDER — CALCIUM CARBONATE ANTACID 500 MG PO CHEW: 2.0000 | CHEWABLE_TABLET | ORAL | Status: DC | PRN

## 2021-08-20 MED ORDER — PRENATAL MULTIVITAMIN CH
1.0000 | ORAL_TABLET | Freq: Every day | ORAL | Status: DC
  Administered 2021-08-21: 1 via ORAL
  Filled 2021-08-20: qty 1

## 2021-08-20 MED ORDER — DOCUSATE SODIUM 100 MG PO CAPS
100.0000 mg | ORAL_CAPSULE | Freq: Every day | ORAL | Status: DC
  Administered 2021-08-21: 100 mg via ORAL
  Filled 2021-08-20: qty 1

## 2021-08-20 MED ORDER — ONDANSETRON HCL 4 MG/2ML IJ SOLN: 4.0000 mg | Freq: Four times a day (QID) | INTRAMUSCULAR | Status: DC | PRN

## 2021-08-20 MED ORDER — ACETAMINOPHEN 325 MG PO TABS: 650.0000 mg | ORAL_TABLET | ORAL | Status: DC | PRN

## 2021-08-20 NOTE — H&P (Addendum)
Antepartum H&P  Megan Beasley 30 y.o. G2P1001 at [redacted]w[redacted]d who was seen in the [facility] office today for growth [place] due to BMI and PIH vs. Chronic hypertension. EFW 23% but AC 5.7%, AFI 5.79cm, BPP 8/8, normal UA dopplers. Last scan was 8/24: EFW was 54% with AC 41.9% and AFI 8.9cm. Dr. Henderson Cloud sent her for direct admission.   [name] states she has been struggling with nausea/vomiting and notes 4.6lb weight loss since 8/31. She states she was not able to eat dinner last night due to nausea and was vomiting this morning. She has a prescription for promethazine that she was not able to take due to inability to tolerate PO. She is feeling well currently and reports appetite for dinner. She reports good fetal movement and denies contractions, LOF, and VB.    Today's Vitals   08/20/21 2005  BP: 105/72  Resp: 16  Temp: 98 F (36.7 C)  TempSrc: Oral  SpO2: 98%   There is no height or weight on file to calculate BMI.  Gen: NAD, sitting in bed CVS: RRR Lungs: Nonlabored breathing Abd: soft, gravid Ext: no calf edema or tenderness  CBC    Component Value Date/Time   WBC 7.2 08/20/2021 1857   RBC 3.91 08/20/2021 1857   HGB 11.5 (L) 08/20/2021 1857   HCT 33.7 (L) 08/20/2021 1857   PLT 233 08/20/2021 1857   MCV 86.2 08/20/2021 1857   MCH 29.4 08/20/2021 1857   MCHC 34.1 08/20/2021 1857   RDW 14.2 08/20/2021 1857   CMP Latest Ref Rng & Units 08/20/2021  Glucose 70 - 99 mg/dL 91  BUN 6 - 20 mg/dL 6  Creatinine 1.24 - 5.80 mg/dL 9.98  Sodium 338 - 250 mmol/L 133(L)  Potassium 3.5 - 5.1 mmol/L 3.6  Chloride 98 - 111 mmol/L 103  CO2 22 - 32 mmol/L 20(L)  Calcium 8.9 - 10.3 mg/dL 9.4  Total Protein 6.5 - 8.1 g/dL 6.7  Total Bilirubin 0.3 - 1.2 mg/dL 0.9  Alkaline Phos 38 - 126 U/L 50  AST 15 - 41 U/L 15  ALT 0 - 44 U/L 12      A/p: Megan Beasley 29 y.o. G2P1001 at [redacted]w[redacted]d HD#1 admitted with new FGR by AC<10% and borderline low AFI  Admit for observation Encourage PO intake and  hydration, IV Zofran PRN. I sent Zofran ODT to her pharmacy.  IV fluids overnight Continuous fetal monitoring overnight Repeat scan and MFM consult tomorrow Chronic vs. Gestational hypertension (mildly elevated at 1st OB appt and 23 and 25 week appts): monitor BP   [name] 08/20/21 8:22 PM

## 2021-08-20 NOTE — Progress Notes (Signed)
Continuous fetal monitoring:   Baseline 140bpm, moderate variability, + accels, no decels Toco: quiet   M. Timothy Lasso, MD

## 2021-08-21 ENCOUNTER — Encounter (HOSPITAL_COMMUNITY): Payer: Self-pay | Admitting: Obstetrics and Gynecology

## 2021-08-21 ENCOUNTER — Observation Stay (HOSPITAL_BASED_OUTPATIENT_CLINIC_OR_DEPARTMENT_OTHER): Payer: 59

## 2021-08-21 DIAGNOSIS — O99213 Obesity complicating pregnancy, third trimester: Secondary | ICD-10-CM | POA: Diagnosis not present

## 2021-08-21 DIAGNOSIS — O365930 Maternal care for other known or suspected poor fetal growth, third trimester, not applicable or unspecified: Secondary | ICD-10-CM | POA: Diagnosis not present

## 2021-08-21 DIAGNOSIS — E669 Obesity, unspecified: Secondary | ICD-10-CM | POA: Diagnosis not present

## 2021-08-21 DIAGNOSIS — O219 Vomiting of pregnancy, unspecified: Secondary | ICD-10-CM | POA: Diagnosis not present

## 2021-08-21 DIAGNOSIS — Z3A30 30 weeks gestation of pregnancy: Secondary | ICD-10-CM

## 2021-08-21 IMAGING — US US MFM UA CORD DOPPLER
1 series · 14 of 28 positions shown · non-contrast
Comparison: none

[Series 1: us mfm ua cord doppler · 51 acquisitions, 14 frames shown]
[im 2/51]
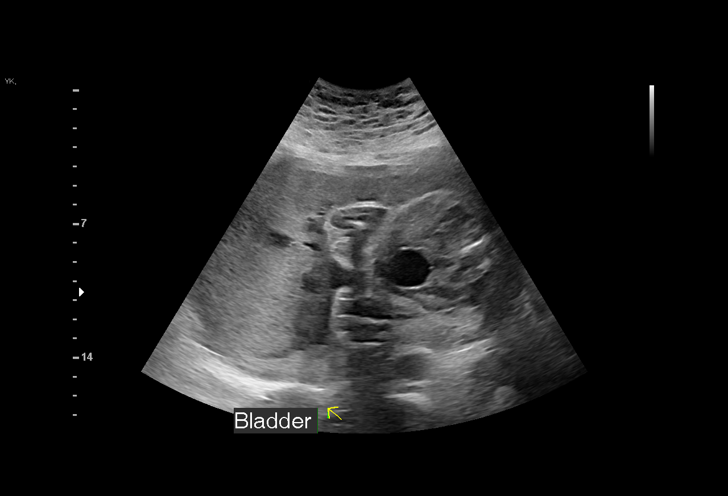
[im 6/51]
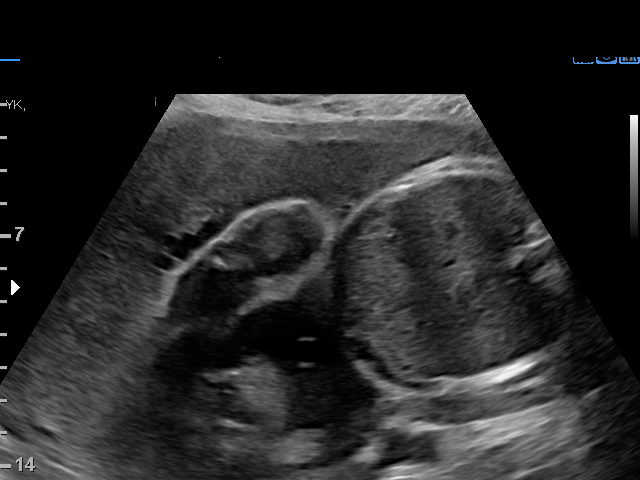
[im 10/51]
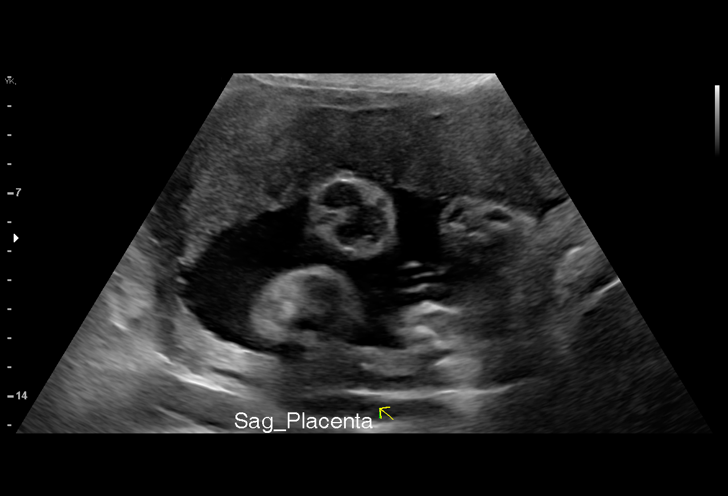
[im 13/51]
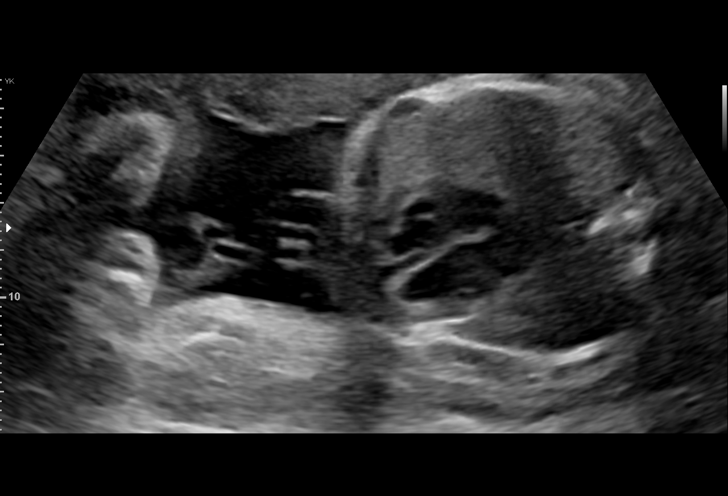
[im 17/51]
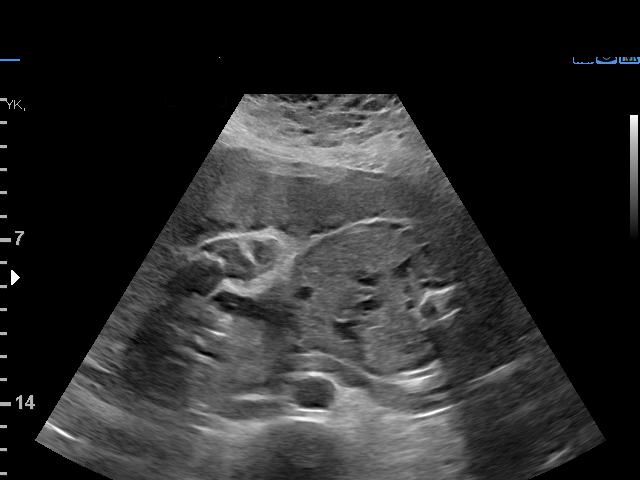
[im 21/51]
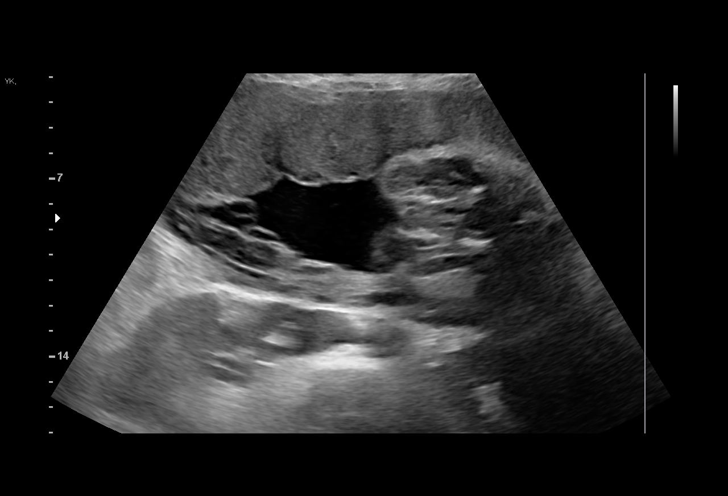
[im 25/51]
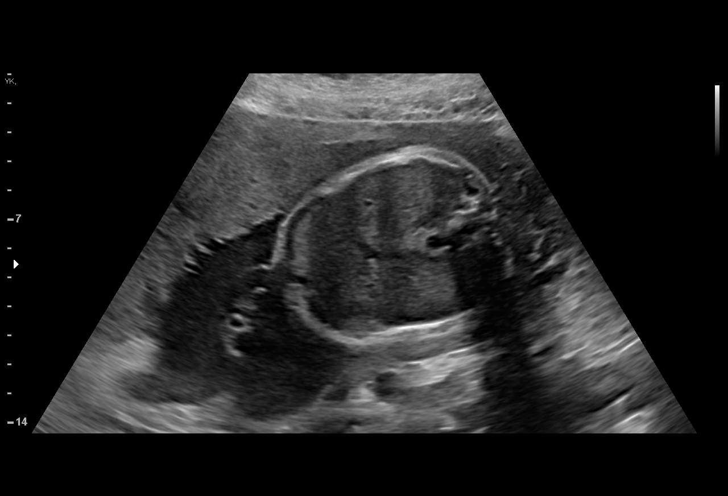
[im 28/51]
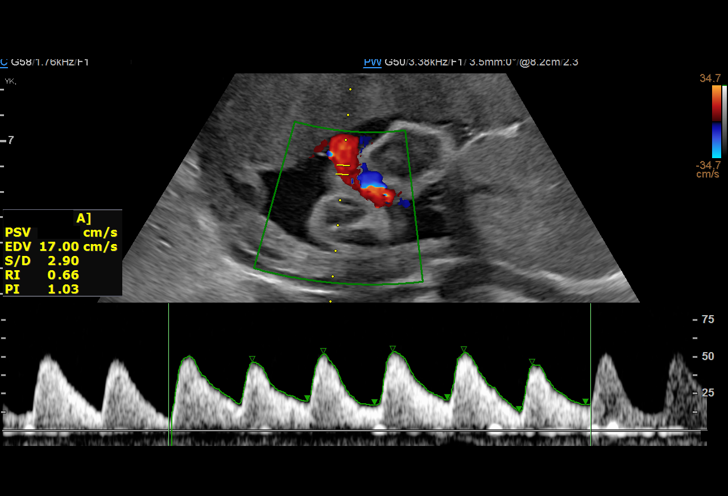
[im 32/51]
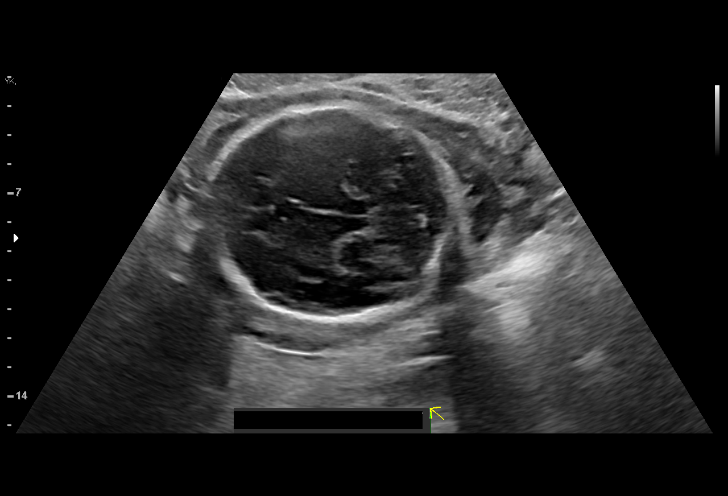
[im 36/51]
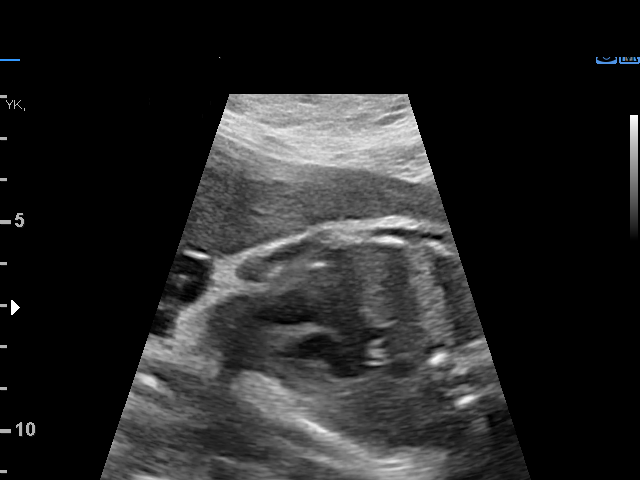
[im 39/51]
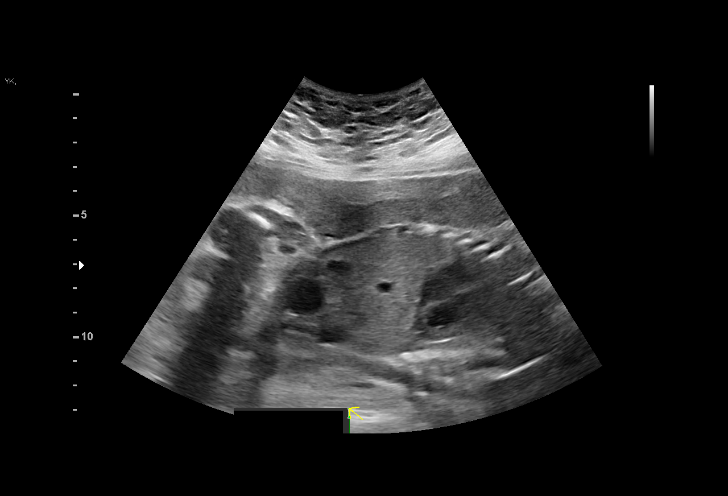
[im 43/51]
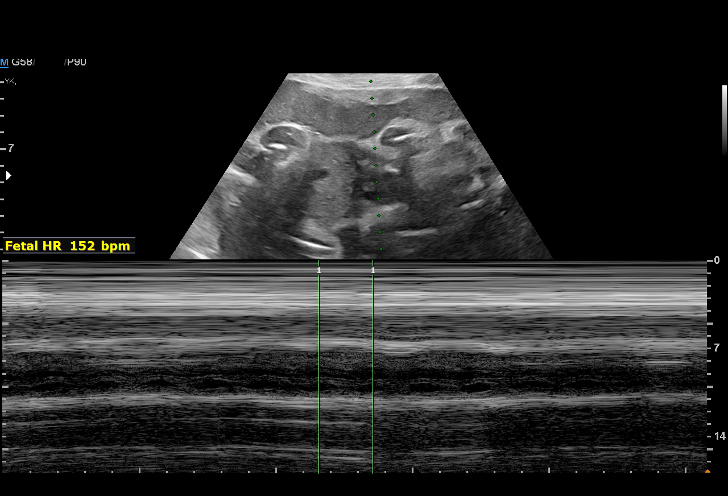
[im 47/51]
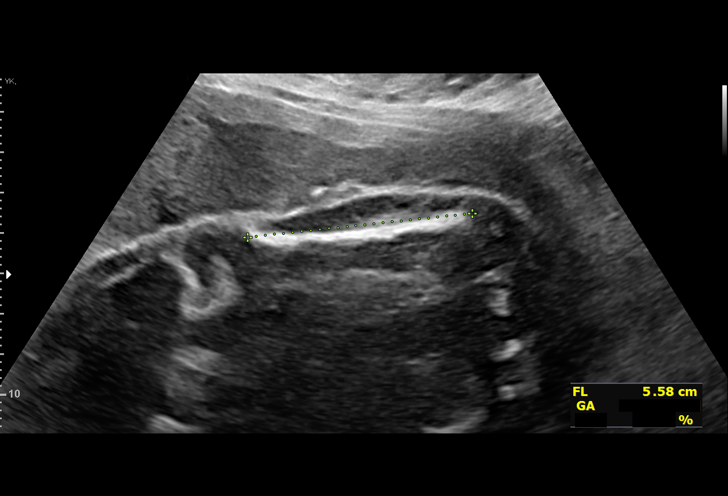
[im 51/51]
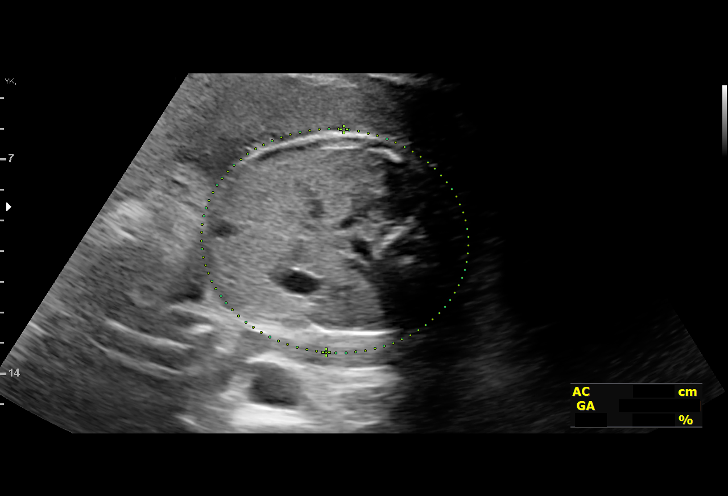

[14 of 28 positions shown; findings below may reference images not displayed]

OBGYN
                   GIORGI

 1  US MFM UA CORD DOPPLER                76820.02    EROS ALVINO

Indications

 Unspecified maternal hypertension, third
 trimester
 Obesity complicating pregnancy, second
 trimester (BMI 61)
 Maternal care for known or suspected poor
 fetal growth, third trimester
 LR NIPS
 History of sickle cell trait
 Medical complication of pregnancy
 (Abnormal PAP, HPV)
 Antenatal follow-up for nonvisualized fetal
 anatomy
 30 weeks gestation of pregnancy

Fetal Evaluation

 Num Of Fetuses:          1
 Fetal Heart Rate(bpm):   173
 Cardiac Activity:        Observed
 Presentation:            Cephalic
 Placenta:                Anterior
 Amniotic Fluid
 AFI FV:      Within normal limits

 AFI Sum(cm)     %Tile       Largest Pocket(cm)
 10.5            17

 RUQ(cm)       RLQ(cm)       LUQ(cm)        LLQ(cm)

Biophysical Evaluation

 Amniotic F.V:   Within normal limits       F. Tone:         Observed
 F. Movement:    Observed                   Score:           [DATE]
 F. Breathing:   Observed
Biometry

 BPD:      73.9  mm     G. Age:  29w 5d         27  %    CI:        72.21   %    70 - 86
                                                         FL/HC:       20.3  %    19.2 -
 HC:      276.7  mm     G. Age:  30w 2d         22  %    HC/AC:       1.08       0.99 -
 AC:      256.4  mm     G. Age:  29w 6d         39  %    FL/BPD:      76.0  %    71 - 87
 FL:       56.2  mm     G. Age:  29w 4d         23  %    FL/AC:       21.9  %    20 - 24

 Est. FW:    8221   gm     3 lb 3 oz     29  %
OB History

 Gravidity:    2
 Living:       1
Gestational Age

 LMP:           30w 0d        Date:  01/23/21                 EDD:   10/30/21
 U/S Today:     29w 6d                                        EDD:   10/31/21
 Best:          30w 0d     Det. By:  LMP  (01/23/21)          EDD:   10/30/21
Anatomy

 Cranium:               Appears normal         Stomach:                Appears normal, left
                                                                       sided
 Cavum:                 Appears normal         Abdomen:                Appears normal
 Ventricles:            Appears normal         Abdominal Wall:         Appears nml (cord
                                                                       insert, abd wall)
 Posterior Fossa:       Appears normal         Kidneys:                Appear normal
 Heart:                 Appears normal         Bladder:                Appears normal
                        (4CH, axis, and
                        situs)
 Diaphragm:             Appears normal
Doppler - Fetal Vessels

 Umbilical Artery
   S/D    %tile      RI    %tile      PI    %tile            ADFV    RDFV
  2.89       53    0.65       55    1.02       63               No      No

Cervix Uterus Adnexa

 Cervix
 Not visualized (advanced GA >43wks)
Impression

 Follow up growth for conerns of fetal growth restriction with
 an AC < 10th%.
 There is good fetal movement and amniotic fluid volume
 Positive interval growth with measurements consistent with
 dates.

 UA Dopplers were performed and are normal.
Recommendations

 Follow up growth as clinically indicated.

## 2021-08-21 MED ORDER — PANTOPRAZOLE SODIUM 40 MG PO TBEC: 40.0000 mg | DELAYED_RELEASE_TABLET | Freq: Every day | ORAL | 0 refills | Status: DC

## 2021-08-21 NOTE — Discharge Summary (Signed)
Physician Discharge Summary  Patient ID: Megan Beasley MRN: [id number] DOB/AGE: 03-09-1991 30 y.o.  Admit date: 08/20/2021 Discharge date: 08/21/2021  Admission Diagnoses: Preterm pregnancy at 30 0/7 weeks Possible IUGR and borderline amniotic fluid Nausea and vomiting  Discharge Diagnoses:  Normal fetal growth and fluid Resolved nausea and vomiting  Discharged Condition: good  Hospital Course: Pt was admitted to [facility] for IV hydration and observation.  She quickly improved and tolerated po intake well.  MFM was asked to consult given possible IUGR by a lagging AC noted on office [place] as well as possible low amniotic fluid. [place] was performed on 08/21/21 and Normal growth noted at 29%ile as well as normal AC of 39%ile.  Fluid also normal at 10.  MFM Dr. Grace Bushy felt that all looked well and patient d/c to home.   Consults:  MFM  Significant Diagnostic Studies:  Ultrasound   Treatments: IV hydration  Discharge Exam: Blood pressure (!) 120/50, pulse 77, temperature 98.5 F (36.9 C), temperature source Oral, resp. rate 18, height 5' 6.5" (1.689 m), weight (!) 168.1 kg, last menstrual period 01/23/2021, SpO2 100 %. General appearance: alert and cooperative GI: abdomen soft NT  Disposition: Discharge disposition: 01-Home or Self Care       Discharge Instructions     Diet - low sodium heart healthy   Complete by: As directed    Discharge instructions   Complete by: As directed    Office should call you on Monday to schedule next appointment      Allergies as of 08/21/2021   No Known Allergies      Medication List     STOP taking these medications    VITAMIN D PO       TAKE these medications    aspirin EC 81 MG tablet Take 81 mg by mouth daily. Swallow whole.   pantoprazole 40 MG tablet Commonly known as: Protonix Take 1 tablet (40 mg total) by mouth daily.   prenatal multivitamin Tabs tablet Take 1 tablet by mouth daily at 12 noon.        Follow-up  Information     Waynard Reeds, MD Follow up.   Specialty: Obstetrics and Gynecology Why: Keep appt with Dr. Tenny Craw 08/25/21 unless office tells you otherwise Contact information: [address] [place] [address] [place] [address] [phone]                 Signed: Oliver Pila 08/21/2021, 5:17 PM

## 2021-08-21 NOTE — Progress Notes (Signed)
Discharge instructions provided to patient. Patient verbalized understanding and had no further questions. Pt ready for discharge.

## 2021-08-21 NOTE — Plan of Care (Signed)
  Problem: Education: Goal: Knowledge of General Education information will improve Description: Including pain rating scale, medication(s)/side effects and non-pharmacologic comfort measures Outcome: Completed/Met   Problem: Activity: Goal: Risk for activity intolerance will decrease Outcome: Completed/Met   Problem: Nutrition: Goal: Adequate nutrition will be maintained Outcome: Completed/Met   Problem: Coping: Goal: Level of anxiety will decrease Outcome: Completed/Met   Problem: Elimination: Goal: Will not experience complications related to urinary retention Outcome: Completed/Met   Problem: Education: Goal: Knowledge of disease or condition will improve Outcome: Completed/Met   

## 2021-08-21 NOTE — Progress Notes (Signed)
Patient ID: Megan Beasley, female   DOB: 20-Mar-1991, 30 y.o.   MRN: [id number] HD #2 for ?IUGR, borderline fluid and N/V  Pt reports feeling much better and able to tolerate po intake weel last pm and today. Good FM and no contractions or LOF  BP WNL Fundus NT  Pt to have MFM consult and repeat US today Clinically she is improved and can likely be d/c home if no acute concerns by MFM She has zofran ODT sent in and d/w her can add an acid blocker since her worst nausea is in AM on awakening.

## 2021-08-21 NOTE — Progress Notes (Signed)
Patient ID: Megan Beasley, female   DOB: 1991/06/20, 30 y.o.   MRN: [id number] Pt continues to tolerate po intake well.with no N/V BP's normal throughout stay   Dr. Grace Bushy reviewed [place] from today and states that growth is normal as well as AC and fluid, so no issues with growth restriction or oligohydramnios.  He states since all WNL he will hold off on consult unless further questionsa arise.   Will d/c pt to home with prn zofran and start protonix as well. Will get office to call with f/u appt on Monday

## 2021-08-21 NOTE — Plan of Care (Signed)
  Problem: Health Behavior/Discharge Planning: Goal: Ability to manage health-related needs will improve Outcome: Completed/Met   Problem: Clinical Measurements: Goal: Ability to maintain clinical measurements within normal limits will improve Outcome: Completed/Met Goal: Will remain free from infection Outcome: Completed/Met Goal: Diagnostic test results will improve Outcome: Completed/Met Goal: Respiratory complications will improve Outcome: Completed/Met Goal: Cardiovascular complication will be avoided Outcome: Completed/Met   Problem: Elimination: Goal: Will not experience complications related to bowel motility Outcome: Completed/Met   Problem: Pain Managment: Goal: General experience of comfort will improve Outcome: Completed/Met   Problem: Safety: Goal: Ability to remain free from injury will improve Outcome: Completed/Met   Problem: Skin Integrity: Goal: Risk for impaired skin integrity will decrease Outcome: Completed/Met   Problem: Education: Goal: Knowledge of the prescribed therapeutic regimen will improve Outcome: Completed/Met Goal: Individualized Educational Video(s) Outcome: Completed/Met   Problem: Clinical Measurements: Goal: Complications related to the disease process, condition or treatment will be avoided or minimized Outcome: Completed/Met

## 2021-10-04 LAB — OB RESULTS CONSOLE GBS: GBS: NEGATIVE

## 2021-10-14 ENCOUNTER — Telehealth (HOSPITAL_COMMUNITY): Payer: Self-pay | Admitting: *Deleted

## 2021-10-19 ENCOUNTER — Ambulatory Visit: Admit: 2021-10-19 | Discharge status: Against Medical Advice | Payer: 59

## 2021-10-21 ENCOUNTER — Other Ambulatory Visit: Payer: Self-pay

## 2021-10-21 ENCOUNTER — Other Ambulatory Visit: Payer: Self-pay | Admitting: Obstetrics and Gynecology

## 2021-10-22 ENCOUNTER — Inpatient Hospital Stay (HOSPITAL_COMMUNITY): Payer: 59 | Admitting: Anesthesiology

## 2021-10-22 ENCOUNTER — Inpatient Hospital Stay (HOSPITAL_COMMUNITY)
Admission: AD | Admit: 2021-10-22 | Discharge: 2021-10-23 | DRG: 807 | Disposition: A | Discharge status: Home / Self Care | Payer: 59 | Attending: Obstetrics and Gynecology | Admitting: Obstetrics and Gynecology

## 2021-10-22 ENCOUNTER — Encounter (HOSPITAL_COMMUNITY): Payer: Self-pay | Admitting: Obstetrics and Gynecology

## 2021-10-22 ENCOUNTER — Inpatient Hospital Stay (HOSPITAL_COMMUNITY): Payer: 59

## 2021-10-22 DIAGNOSIS — O10919 Unspecified pre-existing hypertension complicating pregnancy, unspecified trimester: Secondary | ICD-10-CM | POA: Diagnosis present

## 2021-10-22 DIAGNOSIS — O1002 Pre-existing essential hypertension complicating childbirth: Secondary | ICD-10-CM | POA: Diagnosis present

## 2021-10-22 DIAGNOSIS — D573 Sickle-cell trait: Secondary | ICD-10-CM | POA: Diagnosis present

## 2021-10-22 DIAGNOSIS — Z3A38 38 weeks gestation of pregnancy: Secondary | ICD-10-CM | POA: Diagnosis not present

## 2021-10-22 DIAGNOSIS — O99214 Obesity complicating childbirth: Secondary | ICD-10-CM | POA: Diagnosis present

## 2021-10-22 DIAGNOSIS — O9902 Anemia complicating childbirth: Secondary | ICD-10-CM | POA: Diagnosis present

## 2021-10-22 LAB — CBC
HCT: 31.9 % — ABNORMAL LOW (ref 36.0–46.0)
Hemoglobin: 10.5 g/dL — ABNORMAL LOW (ref 12.0–15.0)
MCH: 28.4 pg (ref 26.0–34.0)
MCHC: 32.9 g/dL (ref 30.0–36.0)
MCV: 86.2 fL (ref 80.0–100.0)
Platelets: 252 10*3/uL (ref 150–400)
RBC: 3.7 MIL/uL — ABNORMAL LOW (ref 3.87–5.11)
RDW: 14.1 % (ref 11.5–15.5)
WBC: 6.4 10*3/uL (ref 4.0–10.5)
nRBC: 0 % (ref 0.0–0.2)

## 2021-10-22 LAB — COMPREHENSIVE METABOLIC PANEL
ALT: 9 U/L (ref 0–44)
AST: 12 U/L — ABNORMAL LOW (ref 15–41)
Albumin: 2.7 g/dL — ABNORMAL LOW (ref 3.5–5.0)
Alkaline Phosphatase: 93 U/L (ref 38–126)
Anion gap: 8 (ref 5–15)
BUN: 5 mg/dL — ABNORMAL LOW (ref 6–20)
CO2: 20 mmol/L — ABNORMAL LOW (ref 22–32)
Calcium: 9.3 mg/dL (ref 8.9–10.3)
Chloride: 107 mmol/L (ref 98–111)
Creatinine, Ser: 0.7 mg/dL (ref 0.44–1.00)
GFR, Estimated: >60 mL/min (ref >60)
Glucose, Bld: 97 mg/dL (ref 70–99)
Potassium: 3.5 mmol/L (ref 3.5–5.1)
Sodium: 135 mmol/L (ref 135–145)
Total Bilirubin: 0.4 mg/dL (ref 0.3–1.2)
Total Protein: 6.4 g/dL — ABNORMAL LOW (ref 6.5–8.1)

## 2021-10-22 LAB — RPR: RPR Ser Ql: NONREACTIVE

## 2021-10-22 LAB — TYPE AND SCREEN
ABO/RH(D): A POS
Antibody Screen: NEGATIVE

## 2021-10-22 MED ORDER — ACETAMINOPHEN 325 MG PO TABS: 650.0000 mg | ORAL_TABLET | ORAL | Status: DC | PRN

## 2021-10-22 MED ORDER — OXYCODONE-ACETAMINOPHEN 5-325 MG PO TABS: 1.0000 | ORAL_TABLET | ORAL | Status: DC | PRN

## 2021-10-22 MED ORDER — SOD CITRATE-CITRIC ACID 500-334 MG/5ML PO SOLN: 30.0000 mL | ORAL | Status: DC | PRN

## 2021-10-22 MED ORDER — OXYTOCIN-SODIUM CHLORIDE 30-0.9 UT/500ML-% IV SOLN
1.0000 m[IU]/min | INTRAVENOUS | Status: DC
  Administered 2021-10-22: 2 m[IU]/min via INTRAVENOUS

## 2021-10-22 MED ORDER — TERBUTALINE SULFATE 1 MG/ML IJ SOLN: 0.2500 mg | Freq: Once | INTRAMUSCULAR | Status: DC | PRN

## 2021-10-22 MED ORDER — LACTATED RINGERS IV SOLN: 500.0000 mL | Freq: Once | INTRAVENOUS | Status: DC

## 2021-10-22 MED ORDER — COCONUT OIL OIL: 1.0000 "application " | TOPICAL_OIL | Status: DC | PRN

## 2021-10-22 MED ORDER — PHENYLEPHRINE 40 MCG/ML (10ML) SYRINGE FOR IV PUSH (FOR BLOOD PRESSURE SUPPORT)
80.0000 ug | PREFILLED_SYRINGE | INTRAVENOUS | Status: DC | PRN
  Filled 2021-10-22: qty 10

## 2021-10-22 MED ORDER — BENZOCAINE-MENTHOL 20-0.5 % EX AERO
1.0000 "application " | INHALATION_SPRAY | CUTANEOUS | Status: DC | PRN
  Administered 2021-10-22: 1 via TOPICAL
  Filled 2021-10-22: qty 56

## 2021-10-22 MED ORDER — ONDANSETRON HCL 4 MG/2ML IJ SOLN: 4.0000 mg | INTRAMUSCULAR | Status: DC | PRN

## 2021-10-22 MED ORDER — MISOPROSTOL 200 MCG PO TABS
1000.0000 ug | ORAL_TABLET | Freq: Once | ORAL | Status: AC
  Administered 2021-10-22: 1000 ug via RECTAL

## 2021-10-22 MED ORDER — OXYTOCIN BOLUS FROM INFUSION
333.0000 mL | Freq: Once | INTRAVENOUS | Status: AC
  Administered 2021-10-22: 333 mL via INTRAVENOUS

## 2021-10-22 MED ORDER — EPHEDRINE 5 MG/ML INJ: 10.0000 mg | INTRAVENOUS | Status: DC | PRN

## 2021-10-22 MED ORDER — TETANUS-DIPHTH-ACELL PERTUSSIS 5-2.5-18.5 LF-MCG/0.5 IM SUSY: 0.5000 mL | PREFILLED_SYRINGE | Freq: Once | INTRAMUSCULAR | Status: DC

## 2021-10-22 MED ORDER — MISOPROSTOL 200 MCG PO TABS
ORAL_TABLET | ORAL | Status: AC
  Filled 2021-10-22: qty 5

## 2021-10-22 MED ORDER — OXYTOCIN-SODIUM CHLORIDE 30-0.9 UT/500ML-% IV SOLN: 2.5000 [IU]/h | INTRAVENOUS | Status: DC | PRN

## 2021-10-22 MED ORDER — ONDANSETRON HCL 4 MG/2ML IJ SOLN
4.0000 mg | Freq: Four times a day (QID) | INTRAMUSCULAR | Status: DC | PRN
  Administered 2021-10-22 (×2): 4 mg via INTRAVENOUS
  Filled 2021-10-22 (×2): qty 2

## 2021-10-22 MED ORDER — SENNOSIDES-DOCUSATE SODIUM 8.6-50 MG PO TABS
2.0000 | ORAL_TABLET | Freq: Every day | ORAL | Status: DC
  Administered 2021-10-23: 2 via ORAL
  Filled 2021-10-22: qty 2

## 2021-10-22 MED ORDER — FENTANYL-BUPIVACAINE-NACL 0.5-0.125-0.9 MG/250ML-% EP SOLN
12.0000 mL/h | EPIDURAL | Status: DC | PRN
  Administered 2021-10-22: 12 mL/h via EPIDURAL
  Filled 2021-10-22: qty 250

## 2021-10-22 MED ORDER — LABETALOL HCL 5 MG/ML IV SOLN
20.0000 mg | INTRAVENOUS | Status: DC | PRN
  Administered 2021-10-22: 20 mg via INTRAVENOUS
  Filled 2021-10-22: qty 4

## 2021-10-22 MED ORDER — LABETALOL HCL 5 MG/ML IV SOLN: 80.0000 mg | INTRAVENOUS | Status: DC | PRN

## 2021-10-22 MED ORDER — LIDOCAINE HCL (PF) 1 % IJ SOLN: 30.0000 mL | INTRAMUSCULAR | Status: DC | PRN

## 2021-10-22 MED ORDER — SIMETHICONE 80 MG PO CHEW: 80.0000 mg | CHEWABLE_TABLET | ORAL | Status: DC | PRN

## 2021-10-22 MED ORDER — ZOLPIDEM TARTRATE 5 MG PO TABS: 5.0000 mg | ORAL_TABLET | Freq: Every evening | ORAL | Status: DC | PRN

## 2021-10-22 MED ORDER — DIBUCAINE (PERIANAL) 1 % EX OINT: 1.0000 "application " | TOPICAL_OINTMENT | CUTANEOUS | Status: DC | PRN

## 2021-10-22 MED ORDER — LABETALOL HCL 5 MG/ML IV SOLN: 40.0000 mg | INTRAVENOUS | Status: DC | PRN

## 2021-10-22 MED ORDER — DIPHENHYDRAMINE HCL 50 MG/ML IJ SOLN: 12.5000 mg | INTRAMUSCULAR | Status: DC | PRN

## 2021-10-22 MED ORDER — MISOPROSTOL 25 MCG QUARTER TABLET
25.0000 ug | ORAL_TABLET | ORAL | Status: DC | PRN
  Filled 2021-10-22: qty 1

## 2021-10-22 MED ORDER — PRENATAL MULTIVITAMIN CH
1.0000 | ORAL_TABLET | Freq: Every day | ORAL | Status: DC
  Administered 2021-10-23: 1 via ORAL
  Filled 2021-10-22: qty 1

## 2021-10-22 MED ORDER — OXYTOCIN-SODIUM CHLORIDE 30-0.9 UT/500ML-% IV SOLN
2.5000 [IU]/h | INTRAVENOUS | Status: DC
  Filled 2021-10-22: qty 500

## 2021-10-22 MED ORDER — LIDOCAINE HCL (PF) 1 % IJ SOLN
INTRAMUSCULAR | Status: DC | PRN
  Administered 2021-10-22: 6 mL via EPIDURAL
  Administered 2021-10-22: 4 mL via EPIDURAL

## 2021-10-22 MED ORDER — IBUPROFEN 600 MG PO TABS
600.0000 mg | ORAL_TABLET | Freq: Four times a day (QID) | ORAL | Status: DC
  Administered 2021-10-22 – 2021-10-23 (×4): 600 mg via ORAL
  Filled 2021-10-22 (×4): qty 1

## 2021-10-22 MED ORDER — LACTATED RINGERS IV SOLN: INTRAVENOUS | Status: DC

## 2021-10-22 MED ORDER — OXYCODONE-ACETAMINOPHEN 5-325 MG PO TABS: 2.0000 | ORAL_TABLET | ORAL | Status: DC | PRN

## 2021-10-22 MED ORDER — PHENYLEPHRINE 40 MCG/ML (10ML) SYRINGE FOR IV PUSH (FOR BLOOD PRESSURE SUPPORT): 80.0000 ug | PREFILLED_SYRINGE | INTRAVENOUS | Status: DC | PRN

## 2021-10-22 MED ORDER — LACTATED RINGERS IV SOLN: 500.0000 mL | INTRAVENOUS | Status: DC | PRN

## 2021-10-22 MED ORDER — HYDRALAZINE HCL 20 MG/ML IJ SOLN: 10.0000 mg | INTRAMUSCULAR | Status: DC | PRN

## 2021-10-22 MED ORDER — WITCH HAZEL-GLYCERIN EX PADS: 1.0000 "application " | MEDICATED_PAD | CUTANEOUS | Status: DC | PRN

## 2021-10-22 MED ORDER — DIPHENHYDRAMINE HCL 25 MG PO CAPS: 25.0000 mg | ORAL_CAPSULE | Freq: Four times a day (QID) | ORAL | Status: DC | PRN

## 2021-10-22 MED ORDER — FENTANYL CITRATE (PF) 100 MCG/2ML IJ SOLN: 50.0000 ug | INTRAMUSCULAR | Status: DC | PRN

## 2021-10-22 MED ORDER — OXYCODONE HCL 5 MG PO TABS: 5.0000 mg | ORAL_TABLET | ORAL | Status: DC | PRN

## 2021-10-22 MED ORDER — OXYCODONE HCL 5 MG PO TABS: 10.0000 mg | ORAL_TABLET | ORAL | Status: DC | PRN

## 2021-10-22 MED ORDER — ONDANSETRON HCL 4 MG PO TABS: 4.0000 mg | ORAL_TABLET | ORAL | Status: DC | PRN

## 2021-10-22 NOTE — Anesthesia Preprocedure Evaluation (Signed)
Anesthesia Evaluation  Patient identified by MRN, date of birth, ID band Patient awake    Reviewed: Allergy & Precautions, H&P , NPO status , Patient's Chart, lab work & pertinent test results  History of Anesthesia Complications Negative for: history of anesthetic complications  Airway Mallampati: II  TM Distance: >3 FB     Dental   Pulmonary neg pulmonary ROS,    Pulmonary exam normal        Cardiovascular hypertension,  Rhythm:regular Rate:Normal     Neuro/Psych negative neurological ROS  negative psych ROS   GI/Hepatic negative GI ROS, Neg liver ROS,   Endo/Other  Morbid obesity  Renal/GU negative Renal ROS  negative genitourinary   Musculoskeletal   Abdominal   Peds  Hematology  (+) Blood dyscrasia, anemia ,   Anesthesia Other Findings   Reproductive/Obstetrics (+) Pregnancy                             Anesthesia Physical Anesthesia Plan  ASA: 3  Anesthesia Plan: Epidural   Post-op Pain Management:    Induction:   PONV Risk Score and Plan:   Airway Management Planned:   Additional Equipment:   Intra-op Plan:   Post-operative Plan:   Informed Consent: I have reviewed the patients History and Physical, chart, labs and discussed the procedure including the risks, benefits and alternatives for the proposed anesthesia with the patient or authorized representative who has indicated his/her understanding and acceptance.       Plan Discussed with:   Anesthesia Plan Comments:         Anesthesia Quick Evaluation

## 2021-10-22 NOTE — Anesthesia Procedure Notes (Signed)
Epidural Patient location during procedure: OB Start time: 10/22/2021 9:41 AM End time: 10/22/2021 10:02 AM  Staffing Anesthesiologist: Lucretia Kern, MD Performed: anesthesiologist   Preanesthetic Checklist Completed: patient identified, IV checked, risks and benefits discussed, monitors and equipment checked, pre-op evaluation and timeout performed  Epidural Patient position: sitting Prep: DuraPrep Patient monitoring: heart rate, continuous pulse ox and blood pressure Approach: midline Location: L3-L4 Injection technique: LOR air  Needle:  Needle type: Tuohy  Needle gauge: 17 G Needle length: 9 cm Needle insertion depth: 12 cm Catheter type: closed end flexible Catheter size: 19 Gauge Catheter at skin depth: 19 cm Test dose: negative  Assessment Events: blood not aspirated, injection not painful, no injection resistance, no paresthesia and negative IV test  Additional Notes Reason for block:procedure for pain

## 2021-10-22 NOTE — H&P (Signed)
Megan Beasley is a 30 y.o. female presenting for induction of labor  30 year old G3 P1-0-0-1 at 38+6 presents for induction of labor for chronic hypertension.  The patient's pregnancy has been complicated by morbid obesity with a BMI of 59 and probable chronic hypertension.  At the patient's initial prenatal visit her blood pressure was 140/80.  She has not been on any medication.  Her blood pressures remained in the high normotensive range until approximately 23 weeks when again her systolic blood pressure was 140.  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  Father of the baby tested negative  Patient's last ultrasound for growth on September 25, 2021 (4#15, 47%ile) OB History     Gravida  2   Para  1   Term  1   Preterm      AB      Living  1      SAB      IAB      Ectopic      Multiple      Live Births  1          Past Medical History:  Diagnosis Date   Vaginal Pap smear, abnormal    Past Surgical History:  Procedure Laterality Date   CHOLECYSTECTOMY  2014   Family History: family history includes Cancer in her mother; Depression in her mother; Diabetes in her mother; Hypertension in her mother; Miscarriages / Stillbirths in her mother; Obesity in her mother. Social History:  reports that she has never smoked. She has never used smokeless tobacco. She reports that she does not drink alcohol and does not use drugs.     Maternal Diabetes: No Genetic Screening: Normal Maternal Ultrasounds/Referrals: Normal Fetal Ultrasounds or other Referrals:  None Maternal Substance Abuse:  No Significant Maternal Medications:  None Significant Maternal Lab Results:  None Other Comments:  None  Review of Systems History Dilation: 3 Effacement (%): 50 Station: -3 Exam by:: L. Manson Passey, RN Blood pressure (!) 153/92, pulse (!) 104, temperature 98.7  F (37.1 C), temperature source Axillary, resp. rate 16, height 5\' 6"  (1.676 m), weight (!) 164.4 kg, last menstrual period 01/23/2021. Exam Physical Exam  Prenatal labs: ABO, Rh: --/--/A POS (11/11 0022) Antibody: NEG (11/11 0022) Rubella: Immune (05/05 0000) RPR: Nonreactive (05/05 0000)  HBsAg: Negative (05/05 0000)  HIV: Non-reactive (05/05 0000)  GBS: Negative/-- (10/24 0000)   Results for orders placed or performed during the hospital encounter of 10/22/21 (from the past 24 hour(s))  CBC     Status: Abnormal   Collection Time: 10/22/21 12:22 AM  Result Value Ref Range   WBC 6.4 4.0 - 10.5 K/uL   RBC 3.70 (L) 3.87 - 5.11 MIL/uL   Hemoglobin 10.5 (L) 12.0 - 15.0 g/dL   HCT 13/11/22 (L) 94.8 - 54.6 %   MCV 86.2 80.0 - 100.0 fL   MCH 28.4 26.0 - 34.0 pg   MCHC 32.9 30.0 - 36.0 g/dL   RDW 27.0 35.0 - 09.3 %   Platelets 252 150 - 400 K/uL   nRBC 0.0 0.0 - 0.2 %  Type and screen     Status: None   Collection Time: 10/22/21 12:22 AM  Result Value Ref Range   ABO/RH(D) A POS    Antibody Screen NEG    Sample Expiration      10/25/2021,2359 Performed at [facility]  Lab, [address] [place], [place] [address]   Comprehensive metabolic panel     Status: Abnormal   Collection Time: 10/22/21 12:22 AM  Result Value Ref Range   Sodium 135 135 - 145 mmol/L   Potassium 3.5 3.5 - 5.1 mmol/L   Chloride 107 98 - 111 mmol/L   CO2 20 (L) 22 - 32 mmol/L   Glucose, Bld 97 70 - 99 mg/dL   BUN 5 (L) 6 - 20 mg/dL   Creatinine, Ser 1.77 0.44 - 1.00 mg/dL   Calcium 9.3 8.9 - 93.9 mg/dL   Total Protein 6.4 (L) 6.5 - 8.1 g/dL   Albumin 2.7 (L) 3.5 - 5.0 g/dL   AST 12 (L) 15 - 41 U/L   ALT 9 0 - 44 U/L   Alkaline Phosphatase 93 38 - 126 U/L   Total Bilirubin 0.4 0.3 - 1.2 mg/dL   GFR, Estimated >03 >00 mL/min   Anion gap 8 5 - 15     Assessment/Plan: 1) Admit 2) Pitocin Augmentation 3) Epidural on request 4) Antihypertensives per protocol for severe range BPS   Waynard Reeds 10/22/2021, 10:12 AM

## 2021-10-22 NOTE — Lactation Note (Signed)
Lactation Consultation Note  Patient Name: Megan Beasley [id number] Date: 10/22/2021   Age:30 y.o.  Maternal Data    Feeding    LATCH Score                    Lactation Tools Discussed/Used    Interventions    Discharge    Consult Status      [name]  [name] 10/22/2021, 6:12 PM

## 2021-10-22 NOTE — Progress Notes (Signed)
RN called Dr Tenny Craw regarding patient's blood pressures of 146/73 and 144/78. Dr. Tenny Craw stated not to call unless BP was greater than 160/110.

## 2021-10-22 NOTE — Progress Notes (Signed)
Per MOB at this point she will call when she would like to see lactation. At this point she doesnot wish to see lactation.

## 2021-10-22 NOTE — Lactation Note (Signed)
This note was copied from a baby's chart. Lactation Consultation Note  Patient Name: Megan Beasley [id number] Date: 10/22/2021   Age:32 hours  Mom declined LC services at this time. She prefers to be seen once on the floor.   Maternal Data    Feeding    LATCH Score                    Lactation Tools Discussed/Used    Interventions    Discharge    Consult Status      Megan Willenbring  Beasley 10/22/2021, 6:12 PM

## 2021-10-22 NOTE — Plan of Care (Signed)
Zari Cly, RN 

## 2021-10-22 NOTE — Progress Notes (Signed)
Patient ID: Megan Beasley, female   DOB: 09-29-1991, 30 y.o.   MRN: [id number]  S: Pt comfortable S/P epidural placement O:  Vitals:   10/22/21 1002 10/22/21 1006 10/22/21 1012 10/22/21 1017  BP: (!) 142/79 (!) 153/92 (!) 156/84 (!) 160/90  Pulse: 84 (!) 104 (!) 106 98  Resp:   16 16  Temp:      TempSrc:      Weight:      Height:       AOx3, NAD Cvx 4/50/-3 FHR 130 reactive, cat 1 tracing Toco irregular  AROM clear fluid  A/P 1) Continue pitocin 2) Labetalol for sustained severe range BPs 3) FWB reassuring

## 2021-10-23 LAB — CBC
HCT: 29.4 % — ABNORMAL LOW (ref 36.0–46.0)
Hemoglobin: 9.8 g/dL — ABNORMAL LOW (ref 12.0–15.0)
MCH: 28.7 pg (ref 26.0–34.0)
MCHC: 33.3 g/dL (ref 30.0–36.0)
MCV: 86.2 fL (ref 80.0–100.0)
Platelets: 215 10*3/uL (ref 150–400)
RBC: 3.41 MIL/uL — ABNORMAL LOW (ref 3.87–5.11)
RDW: 14.3 % (ref 11.5–15.5)
WBC: 10.5 10*3/uL (ref 4.0–10.5)
nRBC: 0 % (ref 0.0–0.2)

## 2021-10-23 MED ORDER — IBUPROFEN 600 MG PO TABS: 600.0000 mg | ORAL_TABLET | Freq: Four times a day (QID) | ORAL | 1 refills | Status: DC | PRN

## 2021-10-23 NOTE — Plan of Care (Signed)
  Problem: Education: Goal: Knowledge of condition will improve Outcome: Completed/Met   Problem: Activity: Goal: Will verbalize the importance of balancing activity with adequate rest periods Outcome: Completed/Met Goal: Ability to tolerate increased activity will improve Outcome: Completed/Met

## 2021-10-23 NOTE — Lactation Note (Signed)
This note was copied from a baby's chart. Lactation Consultation Note  Patient Name: Megan Beasley [id number] Date: 10/23/2021 Reason for consult: Initial assessment;Early term 37-38.6wks Age:32 hours  Initial visit to 68 hours old infant of a P2 mother. Mother states breastfeeding is going well. Mother breastfed her first child for 1 year. Mother is using coconut oil for nipples, encouraged to use expressed colostrum to nipples and air-dry.  Parents report good output.  Discussed normal newborn behavior and patterns after 24h, signs of good milk transfer, maternal self-care.    Plan: 1-Breastfeeding on demand or 8-12 times in 24h period. 2-Use manual pump as needed 3-Encouraged maternal rest, hydration and food intake.   Contact LC as needed for feeds/support/concerns/questions. All questions answered at this time. Provided Lactation services brochure and promoted [employer] booklet information.     Maternal Data Has patient been taught Hand Expression?: Yes Does the patient have breastfeeding experience prior to this delivery?: Yes How long did the patient breastfeed?: 12 months (~9 years ago)  Feeding Mother's Current Feeding Choice: Breast Milk  Lactation Tools Discussed/Used Tools: Pump;Coconut oil Breast pump type: Manual Reason for Pumping: mother's preference  Interventions Interventions: Breast feeding basics reviewed;Hand pump;Coconut oil;Expressed milk;Education;LC Services brochure  Discharge Discharge Education: Engorgement and breast care Pump: Personal;Manual  Consult Status Consult Status: Follow-up Date: 10/24/21    Megan Beasley A Megan Beasley 10/23/2021, 4:46 PM

## 2021-10-23 NOTE — Progress Notes (Deleted)
Patient ID: Megan Beasley, female   DOB: 1991/12/06, 30 y.o.   MRN: [id number] Pt requested discharge to home at 7pm while bilurubin levels still pending on baby. I advised via nurse it would be best to have peds clear baby for circumcision before I proceed.  Pt stated preferred discharge with outpt circumcision via list available through nursing. States had this done with her last child.   I agreed for pt to be discharged with outpt BP check as previously discussed

## 2021-10-23 NOTE — Anesthesia Postprocedure Evaluation (Signed)
Anesthesia Post Note  Patient: Megan Beasley  Procedure(s) Performed: AN AD HOC LABOR EPIDURAL     Patient location during evaluation: Mother Baby Anesthesia Type: Epidural Level of consciousness: awake and alert Pain management: pain level controlled Vital Signs Assessment: post-procedure vital signs reviewed and stable Respiratory status: spontaneous breathing, nonlabored ventilation and respiratory function stable Cardiovascular status: stable Postop Assessment: no headache, no backache and epidural receding Anesthetic complications: no   No notable events documented.  Last Vitals:  Vitals:   10/23/21 0143 10/23/21 0533  BP: (!) 146/75 119/68  Pulse: 85 81  Resp: 18 18  Temp: 37.7 C 37 C  SpO2:      Last Pain:  Vitals:   10/23/21 0533  TempSrc: Oral  PainSc:    Pain Goal:                   Mauricia Area

## 2021-10-23 NOTE — Progress Notes (Signed)
Post Partum Day 1 Subjective: no complaints, up ad lib, voiding, tolerating PO, + flatus, and lochia mild. She reports BM x 2 this am, denies HA, blurry vision , CP or SOB. She is bonding well with baby. Pt ok with discharge home today if baby stable.   Objective: Blood pressure 139/77, pulse 79, temperature 98 F (36.7 C), resp. rate 20, height 5\' 6"  (1.676 m), weight (!) 164.4 kg, last menstrual period 01/23/2021, SpO2 100 %, unknown if currently breastfeeding.  Physical Exam:  General: alert, cooperative, and no distress Lochia: appropriate Uterine Fundus: firm Incision: n/a DVT Evaluation: No evidence of DVT seen on physical exam.  Recent Labs    10/22/21 0022 10/23/21 0443  HGB 10.5* 9.8*  HCT 31.9* 29.4*    Assessment/Plan: Breastfeeding Will consider discharge home today if baby stable for discharge BP stable D/C instructions reviewed    LOS: 1 day   Kurtiss Wence W Kenlee Maler 10/23/2021, 12:30 PM

## 2021-10-23 NOTE — Lactation Note (Signed)
This note was copied from a baby's chart. Lactation Consultation Note  Patient Name: Megan Beasley [id number] Date: 10/23/2021   Age:32 hours LC talked with RN Cala Bradford) mom doesn't want LC services at this time, she will inform [facility] services if she desires help.  Maternal Data    Feeding    LATCH Score                    Lactation Tools Discussed/Used    Interventions    Discharge    Consult Status      Danelle Earthly 10/23/2021, 1:02 AM

## 2021-10-23 NOTE — Discharge Instructions (Signed)
Call office with any concerns [phone] 

## 2021-10-23 NOTE — Discharge Summary (Signed)
Postpartum Discharge Summary  Date of Service updated      Patient Name: Megan Beasley DOB: 06/05/1991 MRN: [id number]  Date of admission: 10/22/2021 Delivery date:10/22/2021  Delivering provider: Waynard Reeds  Date of discharge: 10/23/2021  Admitting diagnosis: Chronic hypertension affecting pregnancy [O10.919] Spontaneous vaginal delivery [O80] Intrauterine pregnancy: [redacted]w[redacted]d     Secondary diagnosis:  Active Problems:   Chronic hypertension affecting pregnancy   Spontaneous vaginal delivery  Additional problems: none    Discharge diagnosis: Term Pregnancy Delivered and CHTN                                              Post partum procedures: none Augmentation: Pitocin Complications: None  Hospital course: Induction of Labor With Vaginal Delivery   30 y.o. yo [id number] at [redacted]w[redacted]d was admitted to the hospital 10/22/2021 for induction of labor.  Indication for induction:  chtn .  Patient had an uncomplicated labor course as follows: Membrane Rupture Time/Date: 10:23 AM ,10/22/2021   Delivery Method:Vaginal, Spontaneous  Episiotomy: None  Lacerations:  None  Details of delivery can be found in separate delivery note.  Patient had a routine postpartum course. Patient is discharged home 10/23/21.  Newborn Data: Birth date:10/22/2021  Birth time:5:13 PM  Gender:Female  Living status:Living  Apgars:9 ,9  Weight:3000 g   Magnesium Sulfate received: No BMZ received: No  Physical exam  Vitals:   10/22/21 2127 10/23/21 0143 10/23/21 0533 10/23/21 1017  BP: (!) 144/78 (!) 146/75 119/68 139/77  Pulse: 92 85 81 79  Resp: 18 18 18 20   Temp: 99.1 F (37.3 C) 99.8 F (37.7 C) 98.6 F (37 C) 98 F (36.7 C)  TempSrc: Oral Oral Oral   SpO2:      Weight:      Height:       General: alert, cooperative, and no distress Lochia: appropriate Uterine Fundus: firm Incision: N/A DVT Evaluation: No evidence of DVT seen on physical exam. Labs: Lab Results  Component Value Date    WBC 10.5 10/23/2021   HGB 9.8 (L) 10/23/2021   HCT 29.4 (L) 10/23/2021   MCV 86.2 10/23/2021   PLT 215 10/23/2021   CMP Latest Ref Rng & Units 10/22/2021  Glucose 70 - 99 mg/dL 97  BUN 6 - 20 mg/dL 5(L)  Creatinine 13/10/2021 - 1.00 mg/dL 6.14  Sodium 4.31 - 540 mmol/L 135  Potassium 3.5 - 5.1 mmol/L 3.5  Chloride 98 - 111 mmol/L 107  CO2 22 - 32 mmol/L 20(L)  Calcium 8.9 - 10.3 mg/dL 9.3  Total Protein 6.5 - 8.1 g/dL 6.4(L)  Total Bilirubin 0.3 - 1.2 mg/dL 0.4  Alkaline Phos 38 - 126 U/L 93  AST 15 - 41 U/L 12(L)  ALT 0 - 44 U/L 9   Edinburgh Score: No flowsheet data found.    After visit meds:  Allergies as of 10/23/2021   No Known Allergies      Medication List     STOP taking these medications    aspirin EC 81 MG tablet       TAKE these medications    ibuprofen 600 MG tablet Commonly known as: ADVIL Take 1 tablet (600 mg total) by mouth every 6 (six) hours as needed for moderate pain or cramping.   pantoprazole 40 MG tablet Commonly known as: Protonix Take 1 tablet (40 mg total) by mouth  daily.   prenatal multivitamin Tabs tablet Take 1 tablet by mouth daily at 12 noon.         Discharge home in stable condition Infant Feeding: Breast Infant Disposition:home with mother Discharge instruction: per After Visit Summary and Postpartum booklet. Activity: Advance as tolerated. Pelvic rest for 6 weeks.  Diet: low salt diet Anticipated Birth Control: Unsure Postpartum Appointment:6 weeks Additional Postpartum F/U: BP check 1 week Future Appointments:No future appointments. Follow up Visit:  Follow-up Information     Ob/Gyn, [facility]. Schedule an appointment as soon as possible for a visit.   Why: 1 week for BP check and 6 weeks for postpartum visit Contact information: [address] [place] [address] [phone]                     10/23/2021 Cathrine Muster, DO

## 2021-11-02 ENCOUNTER — Telehealth (HOSPITAL_COMMUNITY): Payer: Self-pay | Admitting: *Deleted

## 2021-11-02 NOTE — Telephone Encounter (Signed)
Attempted hospital discharge follow-up call. Left message for patient to return RN call. Deforest Hoyles, RN 11/02/21, [phone].

## 2022-06-29 ENCOUNTER — Encounter: Payer: Self-pay | Admitting: General Practice

## 2022-07-26 DIAGNOSIS — Z6841 Body Mass Index (BMI) 40.0 and over, adult: Secondary | ICD-10-CM | POA: Diagnosis not present

## 2022-07-26 DIAGNOSIS — R030 Elevated blood-pressure reading, without diagnosis of hypertension: Secondary | ICD-10-CM | POA: Diagnosis not present

## 2022-07-26 DIAGNOSIS — Z789 Other specified health status: Secondary | ICD-10-CM | POA: Diagnosis not present

## 2022-07-28 ENCOUNTER — Ambulatory Visit (INDEPENDENT_AMBULATORY_CARE_PROVIDER_SITE_OTHER): Payer: BC Managed Care – PPO | Admitting: Family Medicine

## 2022-07-28 ENCOUNTER — Encounter: Payer: Self-pay | Admitting: Family Medicine

## 2022-07-28 VITALS — BP 121/76 | HR 84 | Wt 381.0 lb

## 2022-07-28 DIAGNOSIS — R102 Pelvic and perineal pain: Secondary | ICD-10-CM

## 2022-07-28 DIAGNOSIS — Z3009 Encounter for other general counseling and advice on contraception: Secondary | ICD-10-CM

## 2022-07-28 NOTE — Progress Notes (Signed)
c 

## 2022-07-28 NOTE — Progress Notes (Signed)
   Subjective:    Patient ID: Megan Beasley, female    DOB: [birth year], 31 y.o.   MRN: [id number]  HPI  This is a 31 year old G2 P2-0-0-2 who comes in for consultation for contraception.  The patient has tried several different forms of birth control including NuvaRing, patch, OCPs.  She is interested in the IUD.  She does have heavier menses.  She also has a fair amount of pelvic pain before her period starts.  This started after the birth of her last child.  Her husband is planning on getting a vasectomy, but has not started the process for this yet.  She does have a history of high blood pressure, but currently controlled off medication.  Review of Systems     Objective:   Physical Exam Vitals reviewed.  Constitutional:      Appearance: Normal appearance.  Skin:    Capillary Refill: Capillary refill takes less than 2 seconds.  Neurological:     General: No focal deficit present.     Mental Status: She is alert and oriented to person, place, and time.  Psychiatric:        Mood and Affect: Mood normal.        Behavior: Behavior normal.        Thought Content: Thought content normal.        Judgment: Judgment normal.       Assessment & Plan:  1. Encounter for other general counseling or advice on contraception Discussed options with patient. She would like to try liletta. Will have patient return for insertion  2. Pelvic pain Possibly endometriosis. Discussed that liletta may help with pelvic pain.

## 2022-08-01 DIAGNOSIS — R030 Elevated blood-pressure reading, without diagnosis of hypertension: Secondary | ICD-10-CM | POA: Diagnosis not present

## 2022-08-01 DIAGNOSIS — Z13228 Encounter for screening for other metabolic disorders: Secondary | ICD-10-CM | POA: Diagnosis not present

## 2022-08-01 DIAGNOSIS — Z131 Encounter for screening for diabetes mellitus: Secondary | ICD-10-CM | POA: Diagnosis not present

## 2022-08-01 DIAGNOSIS — Z1329 Encounter for screening for other suspected endocrine disorder: Secondary | ICD-10-CM | POA: Diagnosis not present

## 2022-08-01 DIAGNOSIS — Z13220 Encounter for screening for lipoid disorders: Secondary | ICD-10-CM | POA: Diagnosis not present

## 2022-08-01 DIAGNOSIS — Z789 Other specified health status: Secondary | ICD-10-CM | POA: Diagnosis not present

## 2022-08-01 DIAGNOSIS — Z79899 Other long term (current) drug therapy: Secondary | ICD-10-CM | POA: Diagnosis not present

## 2022-08-01 DIAGNOSIS — Z130 Encounter for screening for diseases of the blood and blood-forming organs and certain disorders involving the immune mechanism: Secondary | ICD-10-CM | POA: Diagnosis not present

## 2022-08-10 DIAGNOSIS — F4322 Adjustment disorder with anxiety: Secondary | ICD-10-CM | POA: Diagnosis not present

## 2022-08-11 ENCOUNTER — Encounter: Payer: Self-pay | Admitting: Family Medicine

## 2022-08-11 ENCOUNTER — Ambulatory Visit (INDEPENDENT_AMBULATORY_CARE_PROVIDER_SITE_OTHER): Payer: BC Managed Care – PPO | Admitting: Family Medicine

## 2022-08-11 ENCOUNTER — Encounter: Payer: Self-pay | Admitting: General Practice

## 2022-08-11 VITALS — BP 154/81 | HR 65 | Ht 66.0 in | Wt 380.0 lb

## 2022-08-11 DIAGNOSIS — Z30430 Encounter for insertion of intrauterine contraceptive device: Secondary | ICD-10-CM | POA: Diagnosis not present

## 2022-08-11 DIAGNOSIS — Z01812 Encounter for preprocedural laboratory examination: Secondary | ICD-10-CM | POA: Diagnosis not present

## 2022-08-11 LAB — POCT URINE PREGNANCY: Preg Test, Ur: NEGATIVE

## 2022-08-11 MED ORDER — LEVONORGESTREL 20.1 MCG/DAY IU IUD
1.0000 | INTRAUTERINE_SYSTEM | Freq: Once | INTRAUTERINE | Status: AC
Start: 2022-08-11 — End: 2022-08-11
  Administered 2022-08-11: 1 via INTRAUTERINE

## 2022-08-11 NOTE — Progress Notes (Signed)
Patient presents for IUD insertion.

## 2022-08-11 NOTE — Progress Notes (Signed)
IUD Procedure Note Patient identified, informed consent performed, signed copy in chart, time out was performed.  Urine pregnancy test negative.  Speculum placed in the vagina.  Cervix visualized.  Cleaned with Betadine x 2.  Paracervical block placed with Lidocaine 2% with epinephrine 82mL spread between the 12 o'clock, 4 o'clock, 8 o'clock positions. Cervix grasped anteriorly with a single tooth tenaculum.  Uterus sounded to 7 cm.  Liletta  IUD placed per manufacturer's recommendations.  Strings trimmed to 3 cm. Tenaculum was removed, good hemostasis noted.  Patient tolerated procedure well.   Patient given post procedure instructions and Liletta care card with expiration date.  Patient is asked to check IUD strings periodically and follow up in 4-6 weeks for IUD check.

## 2022-09-01 DIAGNOSIS — F4322 Adjustment disorder with anxiety: Secondary | ICD-10-CM | POA: Diagnosis not present

## 2022-09-08 ENCOUNTER — Encounter: Payer: Self-pay | Admitting: Family Medicine

## 2022-09-14 ENCOUNTER — Ambulatory Visit: Payer: BC Managed Care – PPO | Admitting: Family Medicine

## 2022-09-21 DIAGNOSIS — E7800 Pure hypercholesterolemia, unspecified: Secondary | ICD-10-CM | POA: Diagnosis not present

## 2022-09-21 DIAGNOSIS — Z87898 Personal history of other specified conditions: Secondary | ICD-10-CM | POA: Diagnosis not present

## 2022-09-22 ENCOUNTER — Other Ambulatory Visit (HOSPITAL_COMMUNITY): Payer: Self-pay | Admitting: General Surgery

## 2022-09-27 ENCOUNTER — Other Ambulatory Visit (HOSPITAL_COMMUNITY): Payer: Self-pay | Admitting: General Surgery

## 2022-09-27 DIAGNOSIS — F5089 Other specified eating disorder: Secondary | ICD-10-CM | POA: Diagnosis not present

## 2022-10-06 DIAGNOSIS — F4322 Adjustment disorder with anxiety: Secondary | ICD-10-CM | POA: Diagnosis not present

## 2022-10-07 ENCOUNTER — Encounter: Payer: BC Managed Care – PPO | Attending: General Surgery | Admitting: Dietician

## 2022-10-07 ENCOUNTER — Encounter: Payer: Self-pay | Admitting: Dietician

## 2022-10-07 DIAGNOSIS — R6889 Other general symptoms and signs: Secondary | ICD-10-CM | POA: Diagnosis not present

## 2022-10-07 DIAGNOSIS — E669 Obesity, unspecified: Secondary | ICD-10-CM

## 2022-10-07 DIAGNOSIS — Z713 Dietary counseling and surveillance: Secondary | ICD-10-CM | POA: Insufficient documentation

## 2022-10-07 DIAGNOSIS — Z87898 Personal history of other specified conditions: Secondary | ICD-10-CM | POA: Diagnosis not present

## 2022-10-07 NOTE — Progress Notes (Signed)
Nutrition Assessment for Bariatric Surgery Medical Nutrition Therapy Appt Start Time: 8:30    End Time: 9:25  Patient was seen on 10/07/2022 for Pre-Operative Nutrition Assessment. Letter of approval faxed to [facility] bariatric surgery program coordinator on 10/07/2022.   Referral stated Supervised Weight Loss (SWL) visits needed: 0  Pt completed visits.   Pt has cleared nutrition requirements.   Planned surgery: RYGB (leaning toward)   NUTRITION ASSESSMENT   Anthropometrics  Start weight at [facility]: 389.7 lbs (date: 10/07/2022)  Height: 66 in BMI: 62.90 kg/m2     Clinical  Medical hx: obesity Medications: n/a  Labs: LDL 122 Notable signs/symptoms: none noted Any previous deficiencies? No  Micronutrient Nutrition Focused Physical Exam: Hair: No issues observed Eyes: No issues observed Mouth: No issues observed Neck: No issues observed Nails: No issues observed Skin: No issues observed  Lifestyle & Dietary Hx  Patient lives with fianc and two children. The pt performs the food shopping and the pt prepares the meals. She reports that she typically skips or misses 4 out of 21 possible meals per week. She may have 8 meals per week that are take-out or at a restaurant.  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 She denies binge eating though has felt shame and/or guilt after eating too much food.  She denies having used laxatives or vomiting to facilitate weight loss. She admits to emotional eating during times of stress. She states that she knows the difference between hunger and thirst and can tell when she is full. Pt states she lost weight during pregnancy of both her children. Pt states all wt loss attempts have been a yo you effect for her.  Pt states she used to love fruit, stating she did not eat fruit while she was pregnant, so she doesn't eat it much anymore, stating she doesn't crave it anymore. Pt states the feeling of fullness, makes her feel  uncomfortable. Pt states she goes to therapy twice a month for stress management, life situations. Pt states she initially thought she wanted to get the sleeve, but states after talking to the surgeon she thinks she will get the RYGB. Pt states she is going to get more labs today.  Physical Activity: Walking 2 days a week at work for one hour, with Co-workers  Sleep Hygiene: duration and quality: restful sleep is important to pt, stating she tries to get 8 hours a night. Pt states her fiance has sleep apnea, stating they sleep in different rooms.  Current Patient Perceived Stress Level as stated by pt on a scale of 1-10:  7      Stress Management Techniques: cuss, therapy  Fruit servings per week: 0 Non starchy vegetable servings per week: 4 Whole Grains per week: 3   24-Hr Dietary Recall First Meal: biscuit with gravy Snack: crackers or popcorn seldom Second Meal: salad with everything Snack:  Third Meal: chicken and rice or potatoes, broccoli Snack:  Beverages: sweet tea, soda, water  Alcoholic beverages per week: 0   Estimated Energy Needs Calories: 1600   NUTRITION DIAGNOSIS  Overweight/obesity ([place]-3.3) related to past poor dietary habits and physical inactivity as evidenced by patient w/ planned RYGB surgery following dietary guidelines for continued weight loss.    NUTRITION INTERVENTION  Nutrition counseling (C-1) and education (E-2) to facilitate bariatric surgery goals.  Educated pt on micronutrient deficiencies post surgery and strategies to mitigate that risk   Pre-Op Goals Reviewed with the Patient Track food and beverage intake (pen  and paper, MyFitness Pal, Baritastic app, etc.) Make healthy food choices while monitoring portion sizes Consume 3 meals per day or try to eat every 3-5 hours Avoid concentrated sugars and fried foods Keep sugar & fat in the single digits per serving on food labels Practice CHEWING your food (aim for applesauce  consistency) Practice not drinking 15 minutes before, during, and 30 minutes after each meal and snack Avoid all carbonated beverages (ex: soda, sparkling beverages)  Limit caffeinated beverages (ex: coffee, tea, energy drinks) Avoid all sugar-sweetened beverages (ex: regular soda, sports drinks)  Avoid alcohol  Aim for 64-100 ounces of FLUID daily (with at least half of fluid intake being plain water)  Aim for at least 60-80 grams of PROTEIN daily Look for a liquid protein source that contains ?15 g protein and ?5 g carbohydrate (ex: shakes, drinks, shots) Make a list of non-food related activities Physical activity is an important part of a healthy lifestyle so keep it moving! The goal is to reach 150 minutes of exercise per week, including cardiovascular and weight baring activity.  *Goals that are bolded indicate the pt would like to start working towards these  Handouts Provided Include  Bariatric Surgery handouts (Nutrition Visits, Pre-Op Goals, Protein Shakes, Vitamins & Minerals)  Learning Style & Readiness for Change Teaching method utilized: Visual & Auditory  Demonstrated degree of understanding via: Teach Back  Readiness Level: Preparation Barriers to learning/adherence to lifestyle change: nothing identified  RD's Notes for Next Visit     MONITORING & EVALUATION Dietary intake, weekly physical activity, body weight, and pre-op goals reached at next nutrition visit.    Next Steps  Pt has completed visits. No further supervised visits required/recomended  Patient is to follow up at [facility] for Pre-Op Class >2 weeks before surgery for further nutrition education.

## 2022-10-10 ENCOUNTER — Ambulatory Visit (HOSPITAL_COMMUNITY)
Admission: RE | Admit: 2022-10-10 | Discharge: 2022-10-10 | Disposition: A | Discharge status: Home / Self Care | Payer: BC Managed Care – PPO | Source: Ambulatory Visit | Attending: General Surgery | Admitting: General Surgery

## 2022-10-10 ENCOUNTER — Other Ambulatory Visit: Payer: Self-pay

## 2022-10-10 DIAGNOSIS — Z01818 Encounter for other preprocedural examination: Secondary | ICD-10-CM | POA: Insufficient documentation

## 2022-10-17 DIAGNOSIS — F4322 Adjustment disorder with anxiety: Secondary | ICD-10-CM | POA: Diagnosis not present

## 2022-10-19 ENCOUNTER — Ambulatory Visit: Payer: BC Managed Care – PPO | Admitting: Family Medicine

## 2022-10-31 DIAGNOSIS — F4322 Adjustment disorder with anxiety: Secondary | ICD-10-CM | POA: Diagnosis not present

## 2022-11-16 ENCOUNTER — Ambulatory Visit: Payer: BC Managed Care – PPO | Admitting: Family Medicine

## 2022-11-16 DIAGNOSIS — F4322 Adjustment disorder with anxiety: Secondary | ICD-10-CM | POA: Diagnosis not present

## 2022-12-15 DIAGNOSIS — R7989 Other specified abnormal findings of blood chemistry: Secondary | ICD-10-CM | POA: Diagnosis not present

## 2022-12-15 DIAGNOSIS — E7800 Pure hypercholesterolemia, unspecified: Secondary | ICD-10-CM | POA: Diagnosis not present

## 2022-12-15 DIAGNOSIS — E559 Vitamin D deficiency, unspecified: Secondary | ICD-10-CM | POA: Diagnosis not present

## 2022-12-19 ENCOUNTER — Encounter: Payer: Self-pay | Admitting: Skilled Nursing Facility1

## 2022-12-19 ENCOUNTER — Encounter: Payer: BC Managed Care – PPO | Attending: General Surgery | Admitting: Skilled Nursing Facility1

## 2022-12-19 VITALS — Wt 385.9 lb

## 2022-12-19 DIAGNOSIS — E669 Obesity, unspecified: Secondary | ICD-10-CM | POA: Insufficient documentation

## 2022-12-19 NOTE — Progress Notes (Unsigned)
Pre-Operative Nutrition Class:    Patient was seen on 12/19/2021 for Pre-Operative Bariatric Surgery Education at the [facility].    Surgery date:  Surgery type: RYGB Start weight at [facility]: 389.7 Weight today: 385.9 pounds  Samples given per MNT protocol. Patient educated on appropriate usage: Procare Multivitamin Lot # [id number] Exp: 09/26 Procare Calcium  Lot #[id number] Exp:01/16/24  The following the learning objectives were met by the patient during this course: Identify Pre-Op Dietary Goals and will begin 2 weeks pre-operatively Identify appropriate sources of fluids and proteins  State protein recommendations and appropriate sources pre and post-operatively Identify Post-Operative Dietary Goals and will follow for 2 weeks post-operatively Identify appropriate multivitamin and calcium sources Describe the need for physical activity post-operatively and will follow MD recommendations State when to call healthcare provider regarding medication questions or post-operative complications When having a diagnosis of diabetes understanding hypoglycemia symptoms and the inclusion of 1 complex carbohydrate per meal  Handouts given during class include: Pre-Op Bariatric Surgery Diet Handout Protein Shake Handout Post-Op Bariatric Surgery Nutrition Handout BELT Program Information Flyer Support Group Information Flyer [facility] Bariatric Supplements Price List  Follow-Up Plan: Patient will follow-up at [facility] 2 weeks post operatively for diet advancement per MD.

## 2022-12-21 ENCOUNTER — Ambulatory Visit: Payer: Self-pay | Admitting: General Surgery

## 2022-12-21 NOTE — Progress Notes (Signed)
Sent message, via epic in basket, requesting orders in epic from surgeon.  

## 2022-12-26 NOTE — Patient Instructions (Signed)
DUE TO COVID-19 ONLY TWO VISITORS  (aged 34 and older)  ARE ALLOWED TO COME WITH YOU AND STAY IN THE WAITING ROOM ONLY DURING PRE OP AND PROCEDURE.   **NO VISITORS ARE ALLOWED IN THE SHORT STAY AREA OR RECOVERY ROOM!!**  IF YOU WILL BE ADMITTED INTO THE HOSPITAL YOU ARE ALLOWED ONLY FOUR SUPPORT PEOPLE DURING VISITATION HOURS ONLY (7 AM -8PM)   The support person(s) must pass our screening, gel in and out, and wear a mask at all times, including in the patient's room. Patients must also wear a mask when staff or their support person are in the room. Visitors GUEST BADGE MUST BE WORN VISIBLY  One adult visitor may remain with you overnight and MUST be in the room by 8 P.M.     Your procedure is scheduled on: 01/03/23   Report to [facility] Main Entrance    Report to admitting at : 7:45 AM   Call this number if you have problems the morning of surgery [phone]   MORNING OF SURGERY DRINK:   DRINK 1 G2 drink BEFORE YOU LEAVE HOME, DRINK ALL OF THE  G2 DRINK AT ONE TIME.   NO SOLID FOOD AFTER 600 PM THE NIGHT BEFORE YOUR SURGERY. YOU MAY DRINK CLEAR FLUIDS. THE G2 DRINK YOU DRINK BEFORE YOU LEAVE HOME WILL BE THE LAST FLUIDS YOU DRINK BEFORE SURGERY.  PAIN IS EXPECTED AFTER SURGERY AND WILL NOT BE COMPLETELY ELIMINATED. AMBULATION AND TYLENOL WILL HELP REDUCE INCISIONAL AND GAS PAIN. MOVEMENT IS KEY!  YOU ARE EXPECTED TO BE OUT OF BED WITHIN 4 HOURS OF ADMISSION TO YOUR PATIENT ROOM.  SITTING IN THE RECLINER THROUGHOUT THE DAY IS IMPORTANT FOR DRINKING FLUIDS AND MOVING GAS THROUGHOUT THE GI TRACT.  COMPRESSION STOCKINGS SHOULD BE WOR[facility] STAY UNLESS YOU ARE WALKING.   INCENTIVE SPIROMETER SHOULD BE USED EVERY HOUR WHILE AWAKE TO DECREASE POST-OPERATIVE COMPLICATIONS SUCH AS PNEUMONIA.  WHEN DISCHARGED HOME, IT IS IMPORTANT TO CONTINUE TO WALK EVERY HOUR AND USE THE INCENTIVE SPIROMETER EVERY HOUR.    After Midnight you may have the following liquids  until : 6:00 AM DAY OF SURGERY  Water Black Coffee (sugar ok, NO MILK/CREAM OR CREAMERS)  Tea (sugar ok, NO MILK/CREAM OR CREAMERS) regular and decaf                             Plain Jell-O (NO RED)                                           Fruit ices (not with fruit pulp, NO RED)                                     Popsicles (NO RED)                                                                  Juice: apple, WHITE grape, WHITE cranberry Sports drinks like Gatorade (NO RED)  The day of surgery:  Drink ONE (1) Pre-Surgery Clear Ensure or G2 : at: 6:00 AM the morning of surgery. Drink in one sitting. Do not sip.  This drink was given to you during your hospital  pre-op appointment visit. Nothing else to drink after completing the  Pre-Surgery Clear Ensure or G2.          If you have questions, please contact your surgeon's office.  Oral Hygiene is also important to reduce your risk of infection.                                    Remember - BRUSH YOUR TEETH THE MORNING OF SURGERY WITH YOUR REGULAR TOOTHPASTE  DENTURES WILL BE REMOVED PRIOR TO SURGERY PLEASE DO NOT APPLY "Poly grip" OR ADHESIVES!!!   Do NOT smoke after Midnight   Take these medicines the morning of surgery with A SIP OF WATER: N/A  DO NOT TAKE ANY ORAL DIABETIC MEDICATIONS DAY OF YOUR SURGERY  Bring CPAP mask and tubing day of surgery.                              You may not have any metal on your body including hair pins, jewelry, and body piercing             Do not wear make-up, lotions, powders, perfumes/cologne, or deodorant  Do not wear nail polish including gel and S&S, artificial/acrylic nails, or any other type of covering on natural nails including finger and toenails. If you have artificial nails, gel coating, etc. that needs to be removed by a nail salon please have this removed prior to surgery or surgery may need to be canceled/ delayed if the surgeon/ anesthesia feels like they are  unable to be safely monitored.   Do not shave  48 hours prior to surgery.   Do not bring valuables to the hospital. [place].   Contacts, glasses, or bridgework may not be worn into surgery.   Bring small overnight bag day of surgery.   DO NOT [place]. PHARMACY WILL DISPENSE MEDICATIONS LISTED ON YOUR MEDICATION LIST TO YOU DURING YOUR ADMISSION [place]!    Patients discharged on the day of surgery will not be allowed to drive home.  Someone NEEDS to stay with you for the first 24 hours after anesthesia.   Special Instructions: Bring a copy of your healthcare power of attorney and living will documents         the day of surgery if you haven't scanned them before.              Please read over the following fact sheets you were given: IF YOU HAVE QUESTIONS ABOUT YOUR PRE-OP INSTRUCTIONS PLEASE CALL [phone]    [facility] - Preparing for Surgery Before surgery, you can play an important role.  Because skin is not sterile, your skin needs to be as free of germs as possible.  You can reduce the number of germs on your skin by washing with CHG (chlorahexidine gluconate) soap before surgery.  CHG is an antiseptic cleaner which kills germs and bonds with the skin to continue killing germs even after washing. Please DO NOT use if you  have an allergy to CHG or antibacterial soaps.  If your skin becomes reddened/irritated stop using the CHG and inform your nurse when you arrive at Short Stay. Do not shave (including legs and underarms) for at least 48 hours prior to the first CHG shower.  You may shave your face/neck. Please follow these instructions carefully:  1.  Shower with CHG Soap the night before surgery and the  morning of Surgery.  2.  If you choose to wash your hair, wash your hair first as usual with your  normal  shampoo.  3.  After you shampoo, rinse your hair and body thoroughly to  remove the  shampoo.                           4.  Use CHG as you would any other liquid soap.  You can apply chg directly  to the skin and wash                       Gently with a scrungie or clean washcloth.  5.  Apply the CHG Soap to your body ONLY FROM THE NECK DOWN.   Do not use on face/ open                           Wound or open sores. Avoid contact with eyes, ears mouth and genitals (private parts).                       Wash face,  Genitals (private parts) with your normal soap.             6.  Wash thoroughly, paying special attention to the area where your surgery  will be performed.  7.  Thoroughly rinse your body with warm water from the neck down.  8.  DO NOT shower/wash with your normal soap after using and rinsing off  the CHG Soap.                9.  Pat yourself dry with a clean towel.            10.  Wear clean pajamas.            11.  Place clean sheets on your bed the night of your first shower and do not  sleep with pets. Day of Surgery : Do not apply any lotions/deodorants the morning of surgery.  Please wear clean clothes to the hospital/surgery center.  FAILURE TO FOLLOW THESE INSTRUCTIONS MAY RESULT IN THE CANCELLATION OF YOUR SURGERY PATIENT SIGNATURE_________________________________  NURSE SIGNATURE__________________________________  ________________________________________________________________________

## 2022-12-28 ENCOUNTER — Encounter (HOSPITAL_COMMUNITY): Payer: Self-pay | Admitting: *Deleted

## 2022-12-28 ENCOUNTER — Other Ambulatory Visit: Payer: Self-pay

## 2022-12-28 ENCOUNTER — Encounter (HOSPITAL_COMMUNITY)
Admission: RE | Admit: 2022-12-28 | Discharge: 2022-12-28 | Disposition: A | Discharge status: Home / Self Care | Payer: BC Managed Care – PPO | Source: Ambulatory Visit | Attending: General Surgery | Admitting: General Surgery

## 2022-12-28 DIAGNOSIS — Z01812 Encounter for preprocedural laboratory examination: Secondary | ICD-10-CM | POA: Diagnosis not present

## 2022-12-28 HISTORY — DX: Anemia, unspecified: D64.9

## 2022-12-28 LAB — COMPREHENSIVE METABOLIC PANEL
ALT: 19 U/L (ref 0–44)
AST: 16 U/L (ref 15–41)
Albumin: 4.2 g/dL (ref 3.5–5.0)
Alkaline Phosphatase: 61 U/L (ref 38–126)
Anion gap: 6 (ref 5–15)
BUN: 13 mg/dL (ref 6–20)
CO2: 25 mmol/L (ref 22–32)
Calcium: 9.5 mg/dL (ref 8.9–10.3)
Chloride: 105 mmol/L (ref 98–111)
Creatinine, Ser: 0.91 mg/dL (ref 0.44–1.00)
GFR, Estimated: >60 mL/min (ref >60)
Glucose, Bld: 93 mg/dL (ref 70–99)
Potassium: 4.1 mmol/L (ref 3.5–5.1)
Sodium: 136 mmol/L (ref 135–145)
Total Bilirubin: 0.5 mg/dL (ref 0.3–1.2)
Total Protein: 8.3 g/dL — ABNORMAL HIGH (ref 6.5–8.1)

## 2022-12-28 LAB — CBC WITH DIFFERENTIAL/PLATELET
Abs Immature Granulocytes: 0.02 10*3/uL (ref 0.00–0.07)
Basophils Absolute: 0 10*3/uL (ref 0.0–0.1)
Basophils Relative: 0 %
Eosinophils Absolute: 0 10*3/uL (ref 0.0–0.5)
Eosinophils Relative: 1 %
HCT: 38.3 % (ref 36.0–46.0)
Hemoglobin: 12.4 g/dL (ref 12.0–15.0)
Immature Granulocytes: 0 %
Lymphocytes Relative: 41 %
Lymphs Abs: 1.9 10*3/uL (ref 0.7–4.0)
MCH: 27.2 pg (ref 26.0–34.0)
MCHC: 32.4 g/dL (ref 30.0–36.0)
MCV: 84 fL (ref 80.0–100.0)
Monocytes Absolute: 0.3 10*3/uL (ref 0.1–1.0)
Monocytes Relative: 6 %
Neutro Abs: 2.4 10*3/uL (ref 1.7–7.7)
Neutrophils Relative %: 52 %
Platelets: 298 10*3/uL (ref 150–400)
RBC: 4.56 MIL/uL (ref 3.87–5.11)
RDW: 14 % (ref 11.5–15.5)
WBC: 4.7 10*3/uL (ref 4.0–10.5)
nRBC: 0 % (ref 0.0–0.2)

## 2022-12-28 NOTE — Progress Notes (Signed)
For Short Stay: [place] appointment date:  Bowel Prep reminder:   For Anesthesia: PCP - Kathe Becton: FNP Cardiologist -   Chest x-ray - 10/10/22 EKG - 10/10/22 Stress Test -  ECHO -  Cardiac Cath -  Pacemaker/ICD device last checked: Pacemaker orders received: Device Rep notified:  Spinal Cord Stimulator:  Sleep Study -  CPAP -   Fasting Blood Sugar -  Checks Blood Sugar _____ times a day Date and result of last Hgb A1c-  Last dose of GLP1 agonist-  GLP1 instructions:   Last dose of SGLT-2 inhibitors-  SGLT-2 instructions:   Blood Thinner Instructions: Aspirin Instructions: Last Dose:  Activity level: Can go up a flight of stairs and activities of daily living without stopping and without chest pain and/or shortness of breath   Able to exercise without chest pain and/or shortness of breath   Unable to go up a flight of stairs without chest pain and/or shortness of breath     Anesthesia review:   Patient denies shortness of breath, fever, cough and chest pain at PAT appointment   Patient verbalized understanding of instructions that were given to them at the PAT appointment. Patient was also instructed that they will need to review over the PAT instructions again at home before surgery.

## 2023-01-02 NOTE — Anesthesia Preprocedure Evaluation (Addendum)
Anesthesia Evaluation  Patient identified by MRN, date of birth, ID band Patient awake    Reviewed: Allergy & Precautions, NPO status , Patient's Chart, lab work & pertinent test results  History of Anesthesia Complications Negative for: history of anesthetic complications  Airway Mallampati: II   Neck ROM: Full    Dental no notable dental hx. (+) Dental Advisory Given   Pulmonary neg pulmonary ROS   Pulmonary exam normal        Cardiovascular negative cardio ROS Normal cardiovascular exam     Neuro/Psych negative neurological ROS     GI/Hepatic negative GI ROS, Neg liver ROS,,,  Endo/Other    Morbid obesity  Renal/GU negative Renal ROS     Musculoskeletal negative musculoskeletal ROS (+)    Abdominal   Peds  Hematology negative hematology ROS (+)   Anesthesia Other Findings   Reproductive/Obstetrics                             Anesthesia Physical Anesthesia Plan  ASA: 3  Anesthesia Plan: General   Post-op Pain Management: Tylenol PO (pre-op)*, Celebrex PO (pre-op)* and Ketamine IV*   Induction: Intravenous  PONV Risk Score and Plan: Aprepitant, Midazolam and Scopolamine patch - Pre-op  Airway Management Planned: Oral ETT  Additional Equipment:   Intra-op Plan:   Post-operative Plan: Extubation in OR  Informed Consent: I have reviewed the patients History and Physical, chart, labs and discussed the procedure including the risks, benefits and alternatives for the proposed anesthesia with the patient or authorized representative who has indicated his/her understanding and acceptance.     Dental advisory given  Plan Discussed with: Anesthesiologist and CRNA  Anesthesia Plan Comments:        Anesthesia Quick Evaluation

## 2023-01-03 ENCOUNTER — Inpatient Hospital Stay (HOSPITAL_COMMUNITY): Payer: BC Managed Care – PPO | Admitting: Certified Registered"

## 2023-01-03 ENCOUNTER — Other Ambulatory Visit: Payer: Self-pay

## 2023-01-03 ENCOUNTER — Encounter (HOSPITAL_COMMUNITY): Payer: Self-pay | Admitting: General Surgery

## 2023-01-03 ENCOUNTER — Encounter (HOSPITAL_COMMUNITY)
Admission: RE | Disposition: A | Discharge status: Home / Self Care | Payer: Self-pay | Source: Home / Self Care | Attending: General Surgery

## 2023-01-03 ENCOUNTER — Inpatient Hospital Stay (HOSPITAL_COMMUNITY)
Admission: RE | Admit: 2023-01-03 | Discharge: 2023-01-04 | DRG: 621 | Disposition: A | Discharge status: Home / Self Care | Payer: BC Managed Care – PPO | Attending: General Surgery | Admitting: General Surgery

## 2023-01-03 DIAGNOSIS — E7800 Pure hypercholesterolemia, unspecified: Secondary | ICD-10-CM | POA: Diagnosis not present

## 2023-01-03 DIAGNOSIS — R7303 Prediabetes: Secondary | ICD-10-CM | POA: Diagnosis not present

## 2023-01-03 DIAGNOSIS — Z83438 Family history of other disorder of lipoprotein metabolism and other lipidemia: Secondary | ICD-10-CM | POA: Diagnosis not present

## 2023-01-03 DIAGNOSIS — Z8249 Family history of ischemic heart disease and other diseases of the circulatory system: Secondary | ICD-10-CM | POA: Diagnosis not present

## 2023-01-03 DIAGNOSIS — Z888 Allergy status to other drugs, medicaments and biological substances status: Secondary | ICD-10-CM

## 2023-01-03 DIAGNOSIS — Z833 Family history of diabetes mellitus: Secondary | ICD-10-CM

## 2023-01-03 DIAGNOSIS — E559 Vitamin D deficiency, unspecified: Secondary | ICD-10-CM | POA: Diagnosis not present

## 2023-01-03 DIAGNOSIS — Z9884 Bariatric surgery status: Principal | ICD-10-CM

## 2023-01-03 DIAGNOSIS — Z6841 Body Mass Index (BMI) 40.0 and over, adult: Secondary | ICD-10-CM | POA: Diagnosis not present

## 2023-01-03 DIAGNOSIS — M170 Bilateral primary osteoarthritis of knee: Secondary | ICD-10-CM | POA: Diagnosis present

## 2023-01-03 HISTORY — PX: UPPER GI ENDOSCOPY: SHX6162

## 2023-01-03 HISTORY — PX: GASTRIC ROUX-EN-Y: SHX5262

## 2023-01-03 LAB — CBC
HCT: 37.6 % (ref 36.0–46.0)
Hemoglobin: 12.2 g/dL (ref 12.0–15.0)
MCH: 27.4 pg (ref 26.0–34.0)
MCHC: 32.4 g/dL (ref 30.0–36.0)
MCV: 84.5 fL (ref 80.0–100.0)
Platelets: 296 10*3/uL (ref 150–400)
RBC: 4.45 MIL/uL (ref 3.87–5.11)
RDW: 13.9 % (ref 11.5–15.5)
WBC: 12.5 10*3/uL — ABNORMAL HIGH (ref 4.0–10.5)
nRBC: 0 % (ref 0.0–0.2)

## 2023-01-03 LAB — TYPE AND SCREEN
ABO/RH(D): A POS
Antibody Screen: NEGATIVE

## 2023-01-03 LAB — POCT PREGNANCY, URINE: Preg Test, Ur: NEGATIVE

## 2023-01-03 LAB — CREATININE, SERUM
Creatinine, Ser: 1.07 mg/dL — ABNORMAL HIGH (ref 0.44–1.00)
GFR, Estimated: >60 mL/min (ref >60)

## 2023-01-03 SURGERY — LAPAROSCOPIC ROUX-EN-Y GASTRIC BYPASS WITH UPPER ENDOSCOPY
Anesthesia: General

## 2023-01-03 MED ORDER — ACETAMINOPHEN 500 MG PO TABS
1000.0000 mg | ORAL_TABLET | ORAL | Status: AC
  Administered 2023-01-03: 1000 mg via ORAL
  Filled 2023-01-03: qty 2

## 2023-01-03 MED ORDER — ONDANSETRON HCL 4 MG/2ML IJ SOLN
INTRAMUSCULAR | Status: AC
  Filled 2023-01-03: qty 2

## 2023-01-03 MED ORDER — SIMETHICONE 80 MG PO CHEW: 80.0000 mg | CHEWABLE_TABLET | Freq: Four times a day (QID) | ORAL | Status: DC | PRN

## 2023-01-03 MED ORDER — ENSURE MAX PROTEIN PO LIQD
2.0000 [oz_av] | ORAL | Status: DC
  Administered 2023-01-04 (×4): 2 [oz_av] via ORAL

## 2023-01-03 MED ORDER — DEXAMETHASONE SODIUM PHOSPHATE 10 MG/ML IJ SOLN
INTRAMUSCULAR | Status: AC
  Filled 2023-01-03: qty 1

## 2023-01-03 MED ORDER — FENTANYL CITRATE (PF) 100 MCG/2ML IJ SOLN
INTRAMUSCULAR | Status: DC | PRN
  Administered 2023-01-03 (×2): 50 ug via INTRAVENOUS
  Administered 2023-01-03: 100 ug via INTRAVENOUS

## 2023-01-03 MED ORDER — ONDANSETRON HCL 4 MG/2ML IJ SOLN
4.0000 mg | Freq: Four times a day (QID) | INTRAMUSCULAR | Status: DC | PRN
  Administered 2023-01-03: 4 mg via INTRAVENOUS

## 2023-01-03 MED ORDER — ROCURONIUM BROMIDE 10 MG/ML (PF) SYRINGE
PREFILLED_SYRINGE | INTRAVENOUS | Status: AC
  Filled 2023-01-03: qty 10

## 2023-01-03 MED ORDER — ACETAMINOPHEN 160 MG/5ML PO SOLN: 1000.0000 mg | Freq: Three times a day (TID) | ORAL | Status: DC

## 2023-01-03 MED ORDER — KETAMINE HCL 50 MG/5ML IJ SOSY
PREFILLED_SYRINGE | INTRAMUSCULAR | Status: AC
  Filled 2023-01-03: qty 5

## 2023-01-03 MED ORDER — ACETAMINOPHEN 500 MG PO TABS: 1000.0000 mg | ORAL_TABLET | Freq: Once | ORAL | Status: DC

## 2023-01-03 MED ORDER — STERILE WATER FOR IRRIGATION IR SOLN
Status: DC | PRN
  Administered 2023-01-03: 1000 mL

## 2023-01-03 MED ORDER — SCOPOLAMINE 1 MG/3DAYS TD PT72: 1.0000 | MEDICATED_PATCH | TRANSDERMAL | Status: DC

## 2023-01-03 MED ORDER — MIDAZOLAM HCL 2 MG/2ML IJ SOLN
INTRAMUSCULAR | Status: AC
  Filled 2023-01-03: qty 2

## 2023-01-03 MED ORDER — FIBRIN SEALANT 2 ML SINGLE DOSE KIT
2.0000 mL | PACK | Freq: Once | CUTANEOUS | Status: AC
  Administered 2023-01-03: 2 mL via TOPICAL
  Filled 2023-01-03: qty 2

## 2023-01-03 MED ORDER — SODIUM CHLORIDE 0.9 % IV SOLN
2.0000 g | INTRAVENOUS | Status: AC
  Administered 2023-01-03: 2 g via INTRAVENOUS
  Filled 2023-01-03: qty 2

## 2023-01-03 MED ORDER — LIDOCAINE 2% (20 MG/ML) 5 ML SYRINGE
INTRAMUSCULAR | Status: DC | PRN
  Administered 2023-01-03: 100 mg via INTRAVENOUS

## 2023-01-03 MED ORDER — SCOPOLAMINE 1 MG/3DAYS TD PT72
1.0000 | MEDICATED_PATCH | TRANSDERMAL | Status: DC
  Administered 2023-01-03: 1.5 mg via TRANSDERMAL
  Filled 2023-01-03: qty 1

## 2023-01-03 MED ORDER — ROCURONIUM BROMIDE 10 MG/ML (PF) SYRINGE
PREFILLED_SYRINGE | INTRAVENOUS | Status: DC | PRN
  Administered 2023-01-03: 100 mg via INTRAVENOUS
  Administered 2023-01-03: 10 mg via INTRAVENOUS
  Administered 2023-01-03: 20 mg via INTRAVENOUS
  Administered 2023-01-03: 10 mg via INTRAVENOUS

## 2023-01-03 MED ORDER — LACTATED RINGERS IR SOLN
Status: DC | PRN
  Administered 2023-01-03: 1000 mL

## 2023-01-03 MED ORDER — BUPIVACAINE HCL 0.25 % IJ SOLN
INTRAMUSCULAR | Status: DC | PRN
  Administered 2023-01-03: 30 mL

## 2023-01-03 MED ORDER — ORAL CARE MOUTH RINSE: 15.0000 mL | Freq: Once | OROMUCOSAL | Status: AC

## 2023-01-03 MED ORDER — LACTATED RINGERS IV SOLN: INTRAVENOUS | Status: DC

## 2023-01-03 MED ORDER — PROPOFOL 10 MG/ML IV BOLUS
INTRAVENOUS | Status: AC
  Filled 2023-01-03: qty 20

## 2023-01-03 MED ORDER — APREPITANT 40 MG PO CAPS
40.0000 mg | ORAL_CAPSULE | ORAL | Status: AC
  Administered 2023-01-03: 40 mg via ORAL
  Filled 2023-01-03: qty 1

## 2023-01-03 MED ORDER — 0.9 % SODIUM CHLORIDE (POUR BTL) OPTIME
TOPICAL | Status: DC | PRN
  Administered 2023-01-03: 1000 mL

## 2023-01-03 MED ORDER — HEPARIN SODIUM (PORCINE) 5000 UNIT/ML IJ SOLN
5000.0000 [IU] | Freq: Three times a day (TID) | INTRAMUSCULAR | Status: DC
  Administered 2023-01-03 – 2023-01-04 (×2): 5000 [IU] via SUBCUTANEOUS
  Filled 2023-01-03 (×2): qty 1

## 2023-01-03 MED ORDER — BUPIVACAINE LIPOSOME 1.3 % IJ SUSP
20.0000 mL | Freq: Once | INTRAMUSCULAR | Status: AC
  Administered 2023-01-03: 20 mL

## 2023-01-03 MED ORDER — KCL IN DEXTROSE-NACL 20-5-0.45 MEQ/L-%-% IV SOLN
INTRAVENOUS | Status: DC
  Filled 2023-01-03 (×3): qty 1000

## 2023-01-03 MED ORDER — BUPIVACAINE HCL (PF) 0.25 % IJ SOLN
INTRAMUSCULAR | Status: AC
  Filled 2023-01-03: qty 30

## 2023-01-03 MED ORDER — SUGAMMADEX SODIUM 500 MG/5ML IV SOLN
INTRAVENOUS | Status: AC
  Filled 2023-01-03: qty 5

## 2023-01-03 MED ORDER — ACETAMINOPHEN 500 MG PO TABS
1000.0000 mg | ORAL_TABLET | Freq: Three times a day (TID) | ORAL | Status: DC
  Administered 2023-01-03 – 2023-01-04 (×2): 1000 mg via ORAL
  Filled 2023-01-03 (×2): qty 2

## 2023-01-03 MED ORDER — ONDANSETRON HCL 4 MG/2ML IJ SOLN
INTRAMUSCULAR | Status: DC | PRN
  Administered 2023-01-03: 4 mg via INTRAVENOUS

## 2023-01-03 MED ORDER — PROPOFOL 10 MG/ML IV BOLUS
INTRAVENOUS | Status: DC | PRN
  Administered 2023-01-03: 200 mg via INTRAVENOUS

## 2023-01-03 MED ORDER — SODIUM CHLORIDE 0.9 % IV SOLN: 12.5000 mg | Freq: Four times a day (QID) | INTRAVENOUS | Status: DC | PRN

## 2023-01-03 MED ORDER — CHLORHEXIDINE GLUCONATE 4 % EX LIQD: Freq: Once | CUTANEOUS | Status: DC

## 2023-01-03 MED ORDER — SUGAMMADEX SODIUM 200 MG/2ML IV SOLN
INTRAVENOUS | Status: DC | PRN
  Administered 2023-01-03: 350 mg via INTRAVENOUS

## 2023-01-03 MED ORDER — AMISULPRIDE (ANTIEMETIC) 5 MG/2ML IV SOLN: 10.0000 mg | Freq: Once | INTRAVENOUS | Status: DC | PRN

## 2023-01-03 MED ORDER — BUPIVACAINE LIPOSOME 1.3 % IJ SUSP
INTRAMUSCULAR | Status: AC
  Filled 2023-01-03: qty 20

## 2023-01-03 MED ORDER — PROMETHAZINE HCL 25 MG/ML IJ SOLN: 6.2500 mg | INTRAMUSCULAR | Status: DC | PRN

## 2023-01-03 MED ORDER — CHLORHEXIDINE GLUCONATE 0.12 % MT SOLN
15.0000 mL | Freq: Once | OROMUCOSAL | Status: AC
  Administered 2023-01-03: 15 mL via OROMUCOSAL

## 2023-01-03 MED ORDER — KETAMINE HCL 10 MG/ML IJ SOLN
INTRAMUSCULAR | Status: DC | PRN
  Administered 2023-01-03 (×2): 20 mg via INTRAVENOUS

## 2023-01-03 MED ORDER — MORPHINE SULFATE (PF) 2 MG/ML IV SOLN: 1.0000 mg | INTRAVENOUS | Status: DC | PRN

## 2023-01-03 MED ORDER — FENTANYL CITRATE (PF) 100 MCG/2ML IJ SOLN
INTRAMUSCULAR | Status: AC
  Filled 2023-01-03: qty 2

## 2023-01-03 MED ORDER — FENTANYL CITRATE PF 50 MCG/ML IJ SOSY: 25.0000 ug | PREFILLED_SYRINGE | INTRAMUSCULAR | Status: DC | PRN

## 2023-01-03 MED ORDER — "VISTASEAL 4 ML SINGLE DOSE KIT "
4.0000 mL | PACK | Freq: Once | CUTANEOUS | Status: AC
  Administered 2023-01-03: 4 mL via TOPICAL
  Filled 2023-01-03: qty 4

## 2023-01-03 MED ORDER — OXYCODONE HCL 5 MG/5ML PO SOLN: 5.0000 mg | Freq: Four times a day (QID) | ORAL | Status: DC | PRN

## 2023-01-03 MED ORDER — DEXAMETHASONE SODIUM PHOSPHATE 10 MG/ML IJ SOLN
INTRAMUSCULAR | Status: DC | PRN
  Administered 2023-01-03: 8 mg via INTRAVENOUS

## 2023-01-03 MED ORDER — MIDAZOLAM HCL 2 MG/2ML IJ SOLN
INTRAMUSCULAR | Status: DC | PRN
  Administered 2023-01-03: 2 mg via INTRAVENOUS

## 2023-01-03 MED ORDER — HEPARIN SODIUM (PORCINE) 5000 UNIT/ML IJ SOLN
5000.0000 [IU] | INTRAMUSCULAR | Status: AC
  Administered 2023-01-03: 5000 [IU] via SUBCUTANEOUS
  Filled 2023-01-03: qty 1

## 2023-01-03 MED ORDER — DEXAMETHASONE SODIUM PHOSPHATE 4 MG/ML IJ SOLN: 4.0000 mg | INTRAMUSCULAR | Status: DC

## 2023-01-03 MED ORDER — PANTOPRAZOLE SODIUM 40 MG IV SOLR
40.0000 mg | Freq: Every day | INTRAVENOUS | Status: DC
  Administered 2023-01-03: 40 mg via INTRAVENOUS
  Filled 2023-01-03: qty 10

## 2023-01-03 SURGICAL SUPPLY — 79 items
ANTIFOG SOL W/FOAM PAD STRL (MISCELLANEOUS) ×1
APPLICATOR COTTON TIP 6 STRL (MISCELLANEOUS) IMPLANT
APPLICATOR COTTON TIP 6IN STRL (MISCELLANEOUS)
APPLICATOR VISTASEAL 35 (MISCELLANEOUS) ×2 IMPLANT
APPLIER CLIP ROT 13.4 12 LRG (CLIP)
BAG COUNTER SPONGE SURGICOUNT (BAG) IMPLANT
BLADE SURG SZ11 CARB STEEL (BLADE) ×1 IMPLANT
CABLE HIGH FREQUENCY MONO STRZ (ELECTRODE) IMPLANT
CHLORAPREP W/TINT 26 (MISCELLANEOUS) ×2 IMPLANT
CLIP APPLIE ROT 13.4 12 LRG (CLIP) IMPLANT
CLIP SUT LAPRA TY ABSORB (SUTURE) ×2 IMPLANT
CUTTER FLEX LINEAR 45M (STAPLE) IMPLANT
DEVICE SUT QUICK LOAD TK 5 (SUTURE) IMPLANT
DEVICE SUT TI-KNOT TK 5X26 (SUTURE) IMPLANT
DEVICE SUTURE ENDOST 10MM (ENDOMECHANICALS) ×1 IMPLANT
DRAIN PENROSE 0.25X18 (DRAIN) ×1 IMPLANT
DRSG TEGADERM 2-3/8X2-3/4 SM (GAUZE/BANDAGES/DRESSINGS) ×6 IMPLANT
ELECT REM PT RETURN 15FT ADLT (MISCELLANEOUS) ×1 IMPLANT
GAUZE 4X4 16PLY ~~LOC~~+RFID DBL (SPONGE) ×1 IMPLANT
GAUZE SPONGE 2X2 8PLY STRL LF (GAUZE/BANDAGES/DRESSINGS) IMPLANT
GAUZE SPONGE 2X2 STRL 8-PLY (GAUZE/BANDAGES/DRESSINGS) ×1 IMPLANT
GAUZE SPONGE 4X4 12PLY STRL (GAUZE/BANDAGES/DRESSINGS) IMPLANT
GLOVE BIO SURGEON STRL SZ7.5 (GLOVE) ×1 IMPLANT
GLOVE INDICATOR 8.0 STRL GRN (GLOVE) ×1 IMPLANT
GOWN STRL REUS W/ TWL XL LVL3 (GOWN DISPOSABLE) ×4 IMPLANT
GOWN STRL REUS W/TWL XL LVL3 (GOWN DISPOSABLE) ×4
IRRIG SUCT STRYKERFLOW 2 WTIP (MISCELLANEOUS) ×1
IRRIGATION SUCT STRKRFLW 2 WTP (MISCELLANEOUS) ×1 IMPLANT
KIT BASIN OR (CUSTOM PROCEDURE TRAY) ×1 IMPLANT
KIT GASTRIC LAVAGE 34FR ADT (SET/KITS/TRAYS/PACK) ×1 IMPLANT
KIT TURNOVER KIT A (KITS) IMPLANT
MARKER SKIN DUAL TIP RULER LAB (MISCELLANEOUS) ×1 IMPLANT
MAT PREVALON FULL STRYKER (MISCELLANEOUS) ×1 IMPLANT
NDL SPNL 22GX3.5 QUINCKE BK (NEEDLE) ×1 IMPLANT
NEEDLE SPNL 22GX3.5 QUINCKE BK (NEEDLE) ×1 IMPLANT
PACK CARDIOVASCULAR III (CUSTOM PROCEDURE TRAY) ×1 IMPLANT
RELOAD 45 VASCULAR/THIN (ENDOMECHANICALS) IMPLANT
RELOAD ENDO STITCH 2.0 (ENDOMECHANICALS) ×9
RELOAD STAPLE 45 2.5 WHT GRN (ENDOMECHANICALS) IMPLANT
RELOAD STAPLE 45 3.5 BLU ETS (ENDOMECHANICALS) IMPLANT
RELOAD STAPLE 60 2.6 WHT THN (STAPLE) ×2 IMPLANT
RELOAD STAPLE 60 3.6 BLU REG (STAPLE) ×2 IMPLANT
RELOAD STAPLE 60 3.8 GOLD REG (STAPLE) ×1 IMPLANT
RELOAD STAPLE TA45 3.5 REG BLU (ENDOMECHANICALS) IMPLANT
RELOAD STAPLER BLUE 60MM (STAPLE) ×4 IMPLANT
RELOAD STAPLER GOLD 60MM (STAPLE) ×3 IMPLANT
RELOAD STAPLER WHITE 60MM (STAPLE) ×1 IMPLANT
RELOAD SUT SNGL STCH ABSRB 2-0 (ENDOMECHANICALS) ×5 IMPLANT
RELOAD SUT SNGL STCH BLK 2-0 (ENDOMECHANICALS) ×4 IMPLANT
SCISSORS LAP 5X45 EPIX DISP (ENDOMECHANICALS) ×1 IMPLANT
SET TUBE SMOKE EVAC HIGH FLOW (TUBING) ×1 IMPLANT
SHEARS HARMONIC ACE PLUS 45CM (MISCELLANEOUS) ×1 IMPLANT
SLEEVE ADV FIXATION 12X100MM (TROCAR) ×2 IMPLANT
SLEEVE ADV FIXATION 5X100MM (TROCAR) IMPLANT
SOLUTION ANTFG W/FOAM PAD STRL (MISCELLANEOUS) ×1 IMPLANT
STAPLE LINE REINFORCEMENT LAP (STAPLE) IMPLANT
STAPLER ECHELON BIOABSB 60 FLE (MISCELLANEOUS) IMPLANT
STAPLER ECHELON LONG 60 440 (INSTRUMENTS) ×1 IMPLANT
STAPLER RELOAD BLUE 60MM (STAPLE) ×4
STAPLER RELOAD GOLD 60MM (STAPLE) ×3
STAPLER RELOAD WHITE 60MM (STAPLE) ×1
STRIP CLOSURE SKIN 1/2X4 (GAUZE/BANDAGES/DRESSINGS) ×1 IMPLANT
SURGILUBE 2OZ TUBE FLIPTOP (MISCELLANEOUS) ×1 IMPLANT
SUT MNCRL AB 4-0 PS2 18 (SUTURE) ×1 IMPLANT
SUT RELOAD ENDO STITCH 2 48X1 (ENDOMECHANICALS) ×5
SUT RELOAD ENDO STITCH 2.0 (ENDOMECHANICALS) ×4
SUT SURGIDAC NAB ES-9 0 48 120 (SUTURE) IMPLANT
SUT VIC AB 2-0 SH 27 (SUTURE) ×1
SUT VIC AB 2-0 SH 27X BRD (SUTURE) ×1 IMPLANT
SUTURE RELOAD END STTCH 2 48X1 (ENDOMECHANICALS) ×5 IMPLANT
SUTURE RELOAD ENDO STITCH 2.0 (ENDOMECHANICALS) ×4 IMPLANT
SYR 20ML LL LF (SYRINGE) ×2 IMPLANT
TOWEL OR 17X26 10 PK STRL BLUE (TOWEL DISPOSABLE) ×1 IMPLANT
TOWEL OR NON WOVEN STRL DISP B (DISPOSABLE) ×1 IMPLANT
TRAY FOLEY MTR SLVR 16FR STAT (SET/KITS/TRAYS/PACK) IMPLANT
TROCAR ADV FIXATION 12X100MM (TROCAR) ×1 IMPLANT
TROCAR ADV FIXATION 5X100MM (TROCAR) ×1 IMPLANT
TROCAR XCEL NON-BLD 5MMX100MML (ENDOMECHANICALS) ×1 IMPLANT
TUBING CONNECTING 10 (TUBING) ×2 IMPLANT

## 2023-01-03 NOTE — Progress Notes (Signed)
Patient seen in PACU after surgery. Alert and oriented, ambulating well. Discussed QI "Goals for Discharge" document with patient including ambulation in halls, Incentive Spirometry use every hour, and oral care.  Also discussed pain and nausea control.  BSTOP education provided including BSTOP information guide, "Guide for Pain Management after your Bariatric Procedure".  Diet progression education provided including "Bariatric Surgery Post-Op Food Plan Phase 1: Liquids".  Questions answered.  Will continue to partner with bedside RN and follow up with patient per protocol after arrival to the floor.

## 2023-01-03 NOTE — Progress Notes (Signed)
PHARMACY CONSULT FOR:  Risk Assessment for Post-Discharge VTE Following Bariatric Surgery  Post-Discharge VTE Risk Assessment: This patient's probability of 30-day post-discharge VTE is increased due to the factors marked:  Sleeve gastrectomy   Liver disorder (transplant, cirrhosis, or nonalcoholic steatohepatitis)   Hx of VTE   Hemorrhage requiring transfusion   GI perforation, leak, or obstruction   ====================================================    Female    Age >/=60 years   x BMI >/=50 kg/m2    CHF    Dyspnea at Rest    Paraplegia  x  Non-gastric-band surgery    Operation Time >/=3 hr    Return to OR     Length of Stay >/= 3 d   Hypercoagulable condition   Significant venous stasis    Predicted probability of 30-day post-discharge VTE: 0.27% mild  Other patient-specific factors to consider: none  Recommendation for Discharge: No pharmacologic prophylaxis post-discharge   Megan Beasley is a 32 y.o. female who underwent  1/23: Laparoscopic Roux-en-Y gastric bypass. Case start 1013 Case end 1157   Allergies  Allergen Reactions   Medroxyprogesterone Acetate Other (See Comments)    Acne   Phentermine Other (See Comments)    Headache    Patient Measurements: Height: 5\' 6"  (167.6 cm) Weight: (!) 172.6 kg (380 lb 9.6 oz) IBW/kg (Calculated) : 59.3 Body mass index is 61.43 kg/m.  No results for input(s): "WBC", "HGB", "HCT", "PLT", "APTT", "CREATININE", "LABCREA", "CREAT24HRUR", "MG", "PHOS", "ALBUMIN", "PROT", "AST", "ALT", "ALKPHOS", "BILITOT", "BILIDIR", "IBILI" in the last 72 hours. Estimated Creatinine Clearance: 145.2 mL/min (by C-G formula based on SCr of 0.91 mg/dL).    Past Medical History:  Diagnosis Date   Anemia    Vaginal Pap smear, abnormal      Medications Prior to Admission  Medication Sig Dispense Refill Last Dose   Cyanocobalamin (B-12 PO) Take 1 tablet by mouth daily.   Past Week   VITAMIN D PO Take 1 capsule by mouth daily.   Past  Week     [employer]. Alford Highland, PharmD, BCPS Clinical Staff Pharmacist [email or web address]  Alford Highland, [employer] 01/03/2023,12:51 PM

## 2023-01-03 NOTE — Anesthesia Postprocedure Evaluation (Signed)
Anesthesia Post Note  Patient: Megan Beasley  Procedure(s) Performed: LAPAROSCOPIC ROUX-EN-Y GASTRIC BYPASS WITH UPPER ENDOSCOPY UPPER GI ENDOSCOPY     Patient location during evaluation: PACU Anesthesia Type: General Level of consciousness: sedated Pain management: pain level controlled Vital Signs Assessment: post-procedure vital signs reviewed and stable Respiratory status: spontaneous breathing and respiratory function stable Cardiovascular status: stable Postop Assessment: no apparent nausea or vomiting Anesthetic complications: no   No notable events documented.  Last Vitals:  Vitals:   01/03/23 1245 01/03/23 1300  BP: (!) 169/88 (!) 146/76  Pulse: 72 70  Resp: 13 16  Temp:  36.4 C  SpO2: 99% 99%    Last Pain:  Vitals:   01/03/23 1300  PainSc: 0-No pain                 Porche Steinberger DANIEL

## 2023-01-03 NOTE — Op Note (Signed)
Megan Beasley [id number] 1991/11/29. 01/03/2023  Preoperative diagnosis:  Severe obesity BMI 61  Vitamin D deficiency  Low vitamin B12 level  Elevated LDL cholesterol level  History of prediabetes   Postoperative  diagnosis:  1. same  Surgical procedure: Laparoscopic Roux-en-Y gastric bypass (ante-colic, ante-gastric); upper endoscopy  Surgeon: Gayland Curry, M.D. FACS  Asst.: Romana Juniper MD FACS  Anesthesia: General plus exparel/marcaine mix  Complications: None   EBL: Minimal   Drains: None   Disposition: PACU in good condition   Indications for procedure: 32 y.o. yo female with morbid obesity who has been unsuccessful at sustained weight loss. The patient's comorbidities are listed above. We discussed the risk and benefits of surgery including but not limited to anesthesia risk, bleeding, infection, blood clot formation, anastomotic leak, anastomotic stricture, ulcer formation, death, respiratory complications, intestinal blockage, internal hernia, gallstone formation, vitamin and nutritional deficiencies, injury to surrounding structures, failure to lose weight and mood changes.   Description of procedure: Patient is brought to the operating room and general anesthesia induced. The patient had received preoperative broad-spectrum IV antibiotics and subcutaneous heparin. The abdomen was widely sterilely prepped with Chloraprep and draped. Patient timeout was performed and correct patient and procedure confirmed. Access was obtained with a 5 mm Optiview trocar in the left upper quadrant and pneumoperitoneum established without difficulty. Under direct vision 12 mm trocars were placed laterally in the right upper quadrant, right upper quadrant midclavicular line, and to the left and above the umbilicus for the camera port. A 5 mm trocar was placed laterally in the left upper quadrant.  Exparel/marcaine mix was infiltrated in bilateral lateral abdominal walls as a TAP block for  postoperative pain relief.  The omentum was brought into the upper abdomen and the transverse mesocolon elevated and the ligament of Treitz clearly identified. A 50 cm biliopancreatic limb was then carefully measured from the ligament of Treitz. The small intestine was divided at this point with a single firing of the white load linear stapler. A Penrose drain was sutured to the end of the Roux-en-Y limb for later identification. A 100 cm Roux-en-Y limb was then carefully measured. At this point a side-to-side anastomosis was created between the Roux limb and the end of the biliopancreatic limb. This was accomplished with a single firing of the 60 mm white load linear stapler. The common enterotomy was closed with a running 2-0 Vicryl begun at either end of the enterotomy and tied centrally. Vistaseal tissue sealant was placed over the anastomosis. The mesenteric defect was then closed with running 2-0 silk. The omentum was then divided with the harmonic scalpel up towards the transverse colon to allow mobility of the Roux limb toward the gastric pouch. The patient was then placed in steep reversed Trendelenburg. Through a 5 mm subxiphoid site the [facility] retractor was placed and the left lobe of the liver elevated with excellent exposure of the upper stomach and hiatus. The angle of Hiss was then mobilized with the harmonic scalpel. A 5 cm gastric pouch was then carefully measured along the lesser curve of the stomach. Dissection was carried along the lesser curve at this point with the Harmonic scalpel working carefully back toward the lesser sac at right angles to the lesser curve. The free lesser sac was then entered. After being sure all tubes were removed from the stomach an initial firing of the gold load 60 mm linear stapler was fired at right angles across the lesser curve for about 4 cm. The gastric  pouch was further mobilized posteriorly and then the pouch was completed with 4 further firings of the 60  mm blue (3 with ethicon staple line reinforcement) load linear stapler up through the previously dissected angle of His. It was ensured that the pouch was completely mobilized away from the gastric remnant. This created a nice tubular 4-5 cm gastric pouch. The Roux limb was then brought up in an antecolic fashion with the candycane facing to the patient's left without undue tension. The gastrojejunostomy was created with an initial posterior row of 2-0 Vicryl between the Roux limb and the staple line of the gastric pouch. Enterotomies were then made in the gastric pouch and the Roux limb with the harmonic scalpel and at approximately 2-2-1/2 cm anastomosis was created with a single firing of the 13mm blue load linear stapler. The staple line was inspected and was intact without bleeding. The common enterotomy was then closed with running 2-0 Vicryl begun at either end and tied centrally. The Ewall tube was then easily passed through the anastomosis and an outer anterior layer of running 2-0 Vicryl was placed. The Ewald tube was removed. With the outlet of the gastrojejunostomy clamped and under saline irrigation the assistant performed upper endoscopy and with the gastric pouch tensely distended with air-there was no evidence of leak on this test. The pouch was desufflated. The Terance Hart defect was closed with running 2-0 silk. The abdomen was inspected for any evidence of bleeding or bowel injury and everything looked fine. The Nathanson retractor was removed under direct vision after coating the anastomosis with vistaseal tissue sealant. All CO2 was evacuated and trochars removed. Skin incisions were closed with 4-0 monocryl in a subcuticular fashion followed by steri-strips and bandages. Sponge needle and instrument counts were correct. The patient was taken to the PACU in good condition.    Leighton Ruff. Redmond Pulling, MD, FACS General, Bariatric, & Minimally Invasive Surgery [facility], [place]

## 2023-01-03 NOTE — Op Note (Signed)
Preoperative diagnosis: Roux-en-Y gastric bypass   Postoperative diagnosis: Same   Procedure: Upper endoscopy   Surgeon: Clovis Riley, M.D.  Anesthesia: Gen.   Description of procedure: The endoscope was placed in the mouth and oropharynx and under endoscopic vision it was advanced to the esophagogastric junction which was identified at 39cm from the teeth.  The pouch was tensely insufflated while the upper abdomen was flooded with irrigation and the roux limb occluded with a bowel clamp to perform a leak test, which was negative. No bubbles were seen.  The staple line was hemostatic and the anastomosis is widely patent. There is no retained fundus. The pouch measures 5cm in length while fully distended.  The lumen was decompressed and the scope was withdrawn without difficulty.    Clovis Riley, M.D. General, Bariatric, & Minimally Invasive Surgery [facility] [place]

## 2023-01-03 NOTE — H&P (Signed)
PROVIDER: Shakemia Madera Leanne Chang, MD  MRN: [id number] DOB: 1991/01/15 DATE OF ENCOUNTER: 12/15/2022 Subjective  Chief Complaint: Follow-up   History of Present Illness: Megan Beasley is a 32 y.o. female who is seen today for her severe obesity and related comorbidities  Her comorbidities include prediabetes, arthritis in both knees, and elevated LDL, vit d def, mild B12 insufficiency  SHe denies any medical changes since I initially saw her in the fall 2023 to discuss bariatric surgery.  She denies any chest pain or chest pressure or shortness of breath, dyspnea exertion abdominal pain. She denies any trips to the emergency room or hospital.  She has decided on the Roux-en-Y gastric bypass.  Her upper GI and chest x-ray were unremarkable. She had had some labs prior to her initial visit but we did end up checking some vitamin levels which revealed vitamin D deficiency and a mild vitamin B12 deficiency. She took the vitamin D prescription and is taking a vitamin D supplement currently. She also did follow the guidelines that we gave her for vitamin B12 supplement.  Review of Systems: A complete review of systems was obtained from the patient. I have reviewed this information and discussed as appropriate with the patient. See HPI as well for other ROS.  ROS  Medical History: History reviewed. No pertinent past medical history.  There is no problem list on file for this patient.  Past Surgical History: Procedure Laterality Date CHOLECYSTECTOMY   No Known Allergies  Current Outpatient Medications on File Prior to Visit Medication Sig Dispense Refill ergocalciferol, vitamin D2, 1,250 mcg (50,000 unit) capsule Take 1 capsule (50,000 Units total) by mouth once a week for 30 days 4 capsule 0  No current facility-administered medications on file prior to visit.  Family History Problem Relation Age of Onset Obesity Mother High blood pressure (Hypertension) Mother Hyperlipidemia  (Elevated cholesterol) Mother Diabetes Mother Obesity Father   Social History  Tobacco Use Smoking Status Never Smokeless Tobacco Never   Social History  Socioeconomic History Marital status: Single Tobacco Use Smoking status: Never Smokeless tobacco: Never Substance and Sexual Activity Alcohol use: Never Drug use: Never  Objective:  Vitals: 12/15/22 1122 BP: 131/78 Pulse: 107 Temp: 36.7 C (98 F) SpO2: 98% Weight: (!) 174.2 kg (384 lb) Height: 167.6 cm (5\' 6" )  Body mass index is 61.98 kg/m.  Gen: alert, NAD, non-toxic appearing Pupils: equal, no scleral icterus Pulm: Lungs clear to auscultation, symmetric chest rise CV: regular rate and rhythm Abd: soft, nontender, nondistended. old trocar sites. No cellulitis. No incisional hernia Ext: no edema, Skin: no rash, no jaundice  Labs, Imaging and Diagnostic Testing:  Labs 10/07/22 Vit d 10.4, vit B12 low at 229 Hpylori neg, tsh ok  Upper gi 10/10/22  IMPRESSION: Unremarkable upper GI series, as described.  Mild levocurvature of the lower thoracic spine incidentally noted.  CXR 10/10/22 - nml  Reviewed nutrition note and psychological evaluation Assessment and Plan: Diagnoses and all orders for this visit:  Severe obesity (CMS-HCC)  Vitamin D deficiency  Low vitamin B12 level  Elevated LDL cholesterol level  History of prediabetes    She has completed the bariatric surgery evaluation process. I still think she is a good candidate for laparoscopic Roux-en-Y gastric bypass. We rediscussed the typical hospitalization and the typical recovery. We discussed the typical quirks that we see in the first few weeks. She is scheduled for her preoperative education class next Monday. We discussed the importance of the preoperative meal plan. She read over the  surgical consent form today and signed it. I offered to rediscussed steps of the surgery along with risk and benefits but she declined. We reviewed  her labs. We will repeat her vitamin levels while she is in the hospital to see what her current levels are doing.  This patient encounter took 23 minutes today to perform the following: take history, perform exam, review outside records, interpret imaging, counsel the patient on their diagnosis and document encounter, findings & plan in the EHR  No follow-ups on file.  Leighton Ruff. Redmond Pulling MD FACS General, Minimally Invasive, & Bariatric Surgery Electronically signed by Rudean Curt, MD at 12/15/2022 12:32 PM EST

## 2023-01-03 NOTE — Anesthesia Procedure Notes (Signed)
Procedure Name: Intubation Date/Time: 01/03/2023 9:44 AM  Performed by: Eben Burow, CRNAPre-anesthesia Checklist: Patient identified, Emergency Drugs available, Suction available, Patient being monitored and Timeout performed Patient Re-evaluated:Patient Re-evaluated prior to induction Oxygen Delivery Method: Circle system utilized Preoxygenation: Pre-oxygenation with 100% oxygen Induction Type: IV induction Ventilation: Mask ventilation without difficulty Laryngoscope Size: Mac and 4 Grade View: Grade I Tube type: Oral Tube size: 7.0 mm Number of attempts: 1 Airway Equipment and Method: Stylet Placement Confirmation: ETT inserted through vocal cords under direct vision, positive ETCO2 and breath sounds checked- equal and bilateral Secured at: 21 cm Tube secured with: Tape Dental Injury: Teeth and Oropharynx as per pre-operative assessment

## 2023-01-03 NOTE — Transfer of Care (Signed)
Immediate Anesthesia Transfer of Care Note  Patient: Megan Beasley  Procedure(s) Performed: LAPAROSCOPIC ROUX-EN-Y GASTRIC BYPASS WITH UPPER ENDOSCOPY UPPER GI ENDOSCOPY  Patient Location: PACU  Anesthesia Type:General  Level of Consciousness: awake, drowsy, and patient cooperative  Airway & Oxygen Therapy: Patient Spontanous Breathing and Patient connected to face mask oxygen  Post-op Assessment: Report given to RN and Post -op Vital signs reviewed and stable  Post vital signs: Reviewed and stable  Last Vitals:  Vitals Value Taken Time  BP 163/76 01/03/23 1206  Temp    Pulse 85 01/03/23 1209  Resp 20 01/03/23 1209  SpO2 100 % 01/03/23 1209  Vitals shown include unvalidated device data.  Last Pain:  Vitals:   01/03/23 0810  PainSc: 0-No pain      Patients Stated Pain Goal: 3 (99/83/38 2505)  Complications: No notable events documented.

## 2023-01-03 NOTE — Interval H&P Note (Signed)
History and Physical Interval Note:  01/03/2023 9:02 AM  Megan Beasley  has presented today for surgery, with the diagnosis of MORBID OBESITY.  The various methods of treatment have been discussed with the patient and family. After consideration of risks, benefits and other options for treatment, the patient has consented to  Procedure(s): LAPAROSCOPIC ROUX-EN-Y GASTRIC BYPASS WITH UPPER ENDOSCOPY (N/A) UPPER GI ENDOSCOPY (N/A) as a surgical intervention.  The patient's history has been reviewed, patient examined, no change in status, stable for surgery.  I have reviewed the patient's chart and labs.  Questions were answered to the patient's satisfaction.     Greer Pickerel

## 2023-01-04 ENCOUNTER — Encounter (HOSPITAL_COMMUNITY): Payer: Self-pay | Admitting: General Surgery

## 2023-01-04 LAB — CBC WITH DIFFERENTIAL/PLATELET
Abs Immature Granulocytes: 0.05 10*3/uL (ref 0.00–0.07)
Basophils Absolute: 0 10*3/uL (ref 0.0–0.1)
Basophils Relative: 0 %
Eosinophils Absolute: 0 10*3/uL (ref 0.0–0.5)
Eosinophils Relative: 0 %
HCT: 35.2 % — ABNORMAL LOW (ref 36.0–46.0)
Hemoglobin: 11.7 g/dL — ABNORMAL LOW (ref 12.0–15.0)
Immature Granulocytes: 1 %
Lymphocytes Relative: 12 %
Lymphs Abs: 1.3 10*3/uL (ref 0.7–4.0)
MCH: 27.5 pg (ref 26.0–34.0)
MCHC: 33.2 g/dL (ref 30.0–36.0)
MCV: 82.6 fL (ref 80.0–100.0)
Monocytes Absolute: 0.7 10*3/uL (ref 0.1–1.0)
Monocytes Relative: 7 %
Neutro Abs: 8.7 10*3/uL — ABNORMAL HIGH (ref 1.7–7.7)
Neutrophils Relative %: 80 %
Platelets: 338 10*3/uL (ref 150–400)
RBC: 4.26 MIL/uL (ref 3.87–5.11)
RDW: 14.1 % (ref 11.5–15.5)
WBC: 10.7 10*3/uL — ABNORMAL HIGH (ref 4.0–10.5)
nRBC: 0 % (ref 0.0–0.2)

## 2023-01-04 LAB — COMPREHENSIVE METABOLIC PANEL
ALT: 23 U/L (ref 0–44)
AST: 19 U/L (ref 15–41)
Albumin: 3.7 g/dL (ref 3.5–5.0)
Alkaline Phosphatase: 54 U/L (ref 38–126)
Anion gap: 8 (ref 5–15)
BUN: 8 mg/dL (ref 6–20)
CO2: 23 mmol/L (ref 22–32)
Calcium: 9 mg/dL (ref 8.9–10.3)
Chloride: 104 mmol/L (ref 98–111)
Creatinine, Ser: 0.91 mg/dL (ref 0.44–1.00)
GFR, Estimated: >60 mL/min (ref >60)
Glucose, Bld: 144 mg/dL — ABNORMAL HIGH (ref 70–99)
Potassium: 4.3 mmol/L (ref 3.5–5.1)
Sodium: 135 mmol/L (ref 135–145)
Total Bilirubin: 0.7 mg/dL (ref 0.3–1.2)
Total Protein: 8.3 g/dL — ABNORMAL HIGH (ref 6.5–8.1)

## 2023-01-04 MED ORDER — PANTOPRAZOLE SODIUM 40 MG PO TBEC: 40.0000 mg | DELAYED_RELEASE_TABLET | Freq: Every day | ORAL | 0 refills | Status: AC

## 2023-01-04 MED ORDER — ONDANSETRON HCL 4 MG PO TABS: 4.0000 mg | ORAL_TABLET | Freq: Three times a day (TID) | ORAL | 0 refills | Status: AC | PRN

## 2023-01-04 MED ORDER — ACETAMINOPHEN 500 MG PO TABS: 1000.0000 mg | ORAL_TABLET | Freq: Three times a day (TID) | ORAL | 0 refills | Status: AC

## 2023-01-04 MED ORDER — TRAMADOL HCL 50 MG PO TABS: 50.0000 mg | ORAL_TABLET | Freq: Four times a day (QID) | ORAL | 0 refills | Status: AC | PRN

## 2023-01-04 NOTE — Discharge Instructions (Addendum)

## 2023-01-04 NOTE — Progress Notes (Signed)
Patient alert and oriented, pain is controlled. Patient is tolerating fluids, advanced to protein shake, patient is tolerating well. Reviewed Gastric sleeve discharge instructions with patient and patient is able to articulate understanding. Provided information on BELT program, Support Group and [facility] outpatient pharmacy. Communicated general update of patient status to surgeon. All questions answered. 24hr fluid recall is 448mL per hydration protocol, bariatric nurse coordinator to make follow-up phone call within one week.    Thank you,  Calton Dach, RN, MSN Bariatric Nurse Coordinator [phone] (office 8-5)

## 2023-01-04 NOTE — Progress Notes (Signed)
Discharge instructions discussed with patient and family, verbalized agreement and understanding 

## 2023-01-04 NOTE — Progress Notes (Signed)
  Transition of Care (TOC) Screening Note   Patient Details  Name: Megan Beasley Date of Birth: 1991/01/15   Transition of Care [facility]) CM/SW Contact:    Lennart Pall, LCSW Phone Number: 01/04/2023, 11:01 AM    Transition of Care Department [facility]) has reviewed patient and no TOC needs have been identified at this time. We will continue to monitor patient advancement through interdisciplinary progression rounds. If new patient transition needs arise, please place a TOC consult.

## 2023-01-05 NOTE — Telephone Encounter (Signed)
Let's talk about Pain with 0 being none to 10 being the worst  Pt reports pain as a 1 out of 10 pt stated overall it is as expected  2. Let's talk about fluid intake. How much total fluid are you taking in? Pt states that s/he is getting in at least 40oz of fluid including protein shakes, bottled water, and popsicles.  3. How much protein have you taken today? Pt states that s/he is exceeding her goal of 60grams oz of protein today. Pt has already been able to consume 60oz of protein per day since surgery and is aiming for more.  4. Have you had nausea? Tell me about when have experienced nausea and what you did to help? Pt denies nausea. 5. Has the frequency or color changed with your urine?  no Pt states that s/he is urinating "fine" with no changes in frequency or urgency 6. Tell me what your incisions look like?  "Incisions look fine". Pt denies a fever, chills. Pt states incisions are not swollen, open, or draining.  7. Have you been passing gas? BM?  Patient has not had a BM yet but plans to take the recommended supplement to help on this evening. Pt instructed to take either Miralax or MoM as instructed per "Gastric Bypass/Sleeve Discharge Home Care Instructions". Pt to call surgeon's office if not able to have BM with medication. 9. How has the been walking going? Pt states s/he is walking around and able to be active without difficulty. 10. Are you still using your incentive spirometer? If so, how often? Pt states that s/he is doing the I.S. Pt encouraged to use incentive spirometer, at least 10x every hour while awake until s/he sees the surgeon. The patient stated that she is using her incentive spirometer 10x per hour and getting 2500.   11. How are your vitamins and calcium going? How are you taking them? pt is taking calcium and tolerating well  pt hasn't started vitamins yet but will when they ship in this week   Thank you,  Calton Dach, RN, MSN Bariatric Nurse  Coordinator [phone] (office)

## 2023-01-05 NOTE — Discharge Summary (Signed)
Physician Discharge Summary  Megan Beasley [facility]:[id number] DOB: 07/11/1991 DOA: 01/03/2023  PCP: Azzie Glatter, FNP  Admit date: 01/03/2023 Discharge date: 01/04/2023  Recommendations for Outpatient Follow-up:     Follow-up Information     Greer Pickerel, MD. Go on 02/02/2023.   Specialty: General Surgery Why: Please arrive 15 minutes prior to your appointment at 8:45 for your follow-up Contact information: [place] [address] [phone]         Lucita Ferrara, PA-C Follow up on 02/28/2023.   Specialty: General Surgery Why: Please arrive 15 minutes prior to your appointment at 2:30pm with Carlena Hurl on behalf of Dr. Cyndi Bender information: [place] [address] [phone]                Discharge Diagnoses:  Principal Problem:   S/P gastric bypass Severe obesity BMI 61  Vitamin D deficiency  Low vitamin B12 level  Elevated LDL cholesterol level  History of prediabetes     Surgical Procedure: Laparoscopic Roux-en-Y gastric bypass, upper endoscopy  Discharge Condition: Good Disposition: Home  Diet recommendation: Postoperative gastric bypass diet  Filed Weights   01/03/23 0810  Weight: (!) 172.6 kg     Hospital Course:  The patient was admitted for a planned laparoscopic Roux-en-Y gastric bypass. Please see operative note. Preoperatively the patient was given 5000 units of subcutaneous heparin for DVT prophylaxis. ERAS protocol was used. Postoperative prophylactic heparin dosing was started on the evening of postoperative day 0.  The patient was started on ice chips and water on the evening of POD 0 which they tolerated. On postoperative day 1 The patient's diet was advanced to protein shakes which they also tolerated. On POD 1, The patient was ambulating without difficulty. Their vital signs are stable without fever or tachycardia. Their hemoglobin had  remained stable. The patient had received discharge instructions and counseling. They were deemed stable for discharge.  BP (!) 148/70 (BP Location: Right Arm)   Pulse 60   Temp 98.6 F (37 C) (Oral)   Resp 18   Ht 5\' 6"  (1.676 m)   Wt (!) 172.6 kg   LMP 12/14/2022   SpO2 100%   BMI 61.43 kg/m   Gen: alert, NAD, non-toxic appearing Pupils: equal, no scleral icterus Pulm: Lungs clear to auscultation, symmetric chest rise CV: regular rate and rhythm Abd: soft, min tender, nondistended. No cellulitis. No incisional hernia Ext: no edema, no calf tenderness Skin: no rash, no jaundice  Discharge Instructions  Discharge Instructions     Ambulate hourly while awake   Complete by: As directed    Call MD for:  difficulty breathing, headache or visual disturbances   Complete by: As directed    Call MD for:  persistant dizziness or light-headedness   Complete by: As directed    Call MD for:  persistant nausea and vomiting   Complete by: As directed    Call MD for:  redness, tenderness, or signs of infection (pain, swelling, redness, odor or green/yellow discharge around incision site)   Complete by: As directed    Call MD for:  severe uncontrolled pain   Complete by: As directed    Call MD for:  temperature >101 F   Complete by: As directed    Diet bariatric full liquid   Complete by: As directed    Discharge instructions   Complete by: As directed    See bariatric discharge  instructions   Incentive spirometry   Complete by: As directed    Perform hourly while awake      Allergies as of 01/04/2023       Reactions   Medroxyprogesterone Acetate Other (See Comments)   Acne   Phentermine Other (See Comments)   Headache        Medication List     TAKE these medications    acetaminophen 500 MG tablet Commonly known as: TYLENOL Take 2 tablets (1,000 mg total) by mouth every 8 (eight) hours for 5 days.   B-12 PO Take 1 tablet by mouth daily. Notes to patient:  Review with primary care physician as these vitamins may be covered with the bariatric multivitamins   ondansetron 4 MG tablet Commonly known as: ZOFRAN Take 1 tablet (4 mg total) by mouth every 8 (eight) hours as needed for nausea or vomiting.   pantoprazole 40 MG tablet Commonly known as: PROTONIX Take 1 tablet (40 mg total) by mouth daily.   traMADol 50 MG tablet Commonly known as: ULTRAM Take 1 tablet (50 mg total) by mouth every 6 (six) hours as needed (pain).   VITAMIN D PO Take 1 capsule by mouth daily. Notes to patient: Review with primary care physician as these vitamins may be covered with the bariatric multivitamins        Follow-up Information     Greer Pickerel, MD. Go on 02/02/2023.   Specialty: General Surgery Why: Please arrive 15 minutes prior to your appointment at 8:45 for your follow-up Contact information: [place] [address] [phone]         Lucita Ferrara, PA-C Follow up on 02/28/2023.   Specialty: General Surgery Why: Please arrive 15 minutes prior to your appointment at 2:30pm with Carlena Hurl on behalf of Dr. Cyndi Bender information: [place] [address] [phone]                  The results of significant diagnostics from this hospitalization (including imaging, microbiology, ancillary and laboratory) are listed below for reference.    Significant Diagnostic Studies: No results found.  Labs: Basic Metabolic Panel: Recent Labs  Lab 01/03/23 1702 01/04/23 0453  NA  --  135  K  --  4.3  CL  --  104  CO2  --  23  GLUCOSE  --  144*  BUN  --  8  CREATININE 1.07* 0.91  CALCIUM  --  9.0   Liver Function Tests: Recent Labs  Lab 01/04/23 0453  AST 19  ALT 23  ALKPHOS 54  BILITOT 0.7  PROT 8.3*  ALBUMIN 3.7    CBC: Recent Labs  Lab 01/03/23 1702 01/04/23 0453  WBC 12.5* 10.7*  NEUTROABS  --  8.7*  HGB 12.2 11.7*   HCT 37.6 35.2*  MCV 84.5 82.6  PLT 296 338    CBG: No results for input(s): "GLUCAP" in the last 168 hours.  Principal Problem:   S/P gastric bypass   Time coordinating discharge: 15 min  Signed:  Gayland Curry, MD [facility], [place] [phone] 01/05/2023, 10:20 AM

## 2023-01-17 ENCOUNTER — Encounter: Payer: BC Managed Care – PPO | Attending: General Surgery | Admitting: Dietician

## 2023-01-17 ENCOUNTER — Encounter: Payer: Self-pay | Admitting: Dietician

## 2023-01-17 VITALS — Ht 66.0 in | Wt 364.1 lb

## 2023-01-17 DIAGNOSIS — E669 Obesity, unspecified: Secondary | ICD-10-CM | POA: Diagnosis not present

## 2023-01-17 NOTE — Progress Notes (Signed)
2 Week Post-Operative Nutrition Class   Patient was seen on 01/17/2023 for Post-Operative Nutrition education at the [facility].    Surgery date: 01/03/2023 Surgery type: RYGB  Anthropometrics  Start weight at [facility]: 389.7 lbs (date: 10/07/2022)  Height: 66 in Weight today: 364.1 lbs. BMI: 58.77 kg/m2     Clinical  Medical hx: obesity Medications: n/a  Labs: LDL 122 Notable signs/symptoms: none noted Any previous deficiencies? No Bowel Habits: Every day to every other day no complaints   Body Composition Scale 01/17/2023  Current Body Weight 364.1  Total Body Fat % 51.3  Visceral Fat 20  Fat-Free Mass % 48.6   Total Body Water % 38.8  Muscle-Mass lbs 35.5  BMI 58.8  Body Fat Displacement          Torso  lbs 116.1         Left Leg  lbs 23.2         Right Leg  lbs 23.2         Left Arm  lbs 11.6         Right Arm   lbs 11.6      The following the learning objectives were met by the patient during this course: Identifies Phase 3 (Soft, High Proteins) Dietary Goals and will begin from 2 weeks post-operatively to 2 months post-operatively Identifies appropriate sources of fluids and proteins  Identifies appropriate fat sources and healthy verses unhealthy fat types   States protein recommendations and appropriate sources post-operatively Identifies the need for appropriate texture modifications, mastication, and bite sizes when consuming solids Identifies appropriate fat consumption and sources Identifies appropriate multivitamin and calcium sources post-operatively Describes the need for physical activity post-operatively and will follow MD recommendations States when to call healthcare provider regarding medication questions or post-operative complications   Handouts given during class include: Soft Prepped Plan Advancement Guide   Follow-Up Plan: Dietitian will call pt in 9 days Patient will follow-up at [facility] in 2.5 months for 3 month  post-op nutrition visit for diet advancement per MD.

## 2023-01-26 ENCOUNTER — Telehealth: Payer: Self-pay | Admitting: Dietician

## 2023-01-26 NOTE — Telephone Encounter (Signed)
Pt returned call:   No issues  Daily Fluid intake: 64 oz  Daily Protein intake:  60 grams Bowel Habits: daily, no complaints.  Concerns/issues:  tolerating most foods she has tried

## 2023-01-26 NOTE — Telephone Encounter (Signed)
RD called pt to verify fluid intake once starting soft, solid proteins 2 week post-bariatric surgery.   Daily Fluid intake:  Daily Protein intake:  Bowel Habits:   Concerns/issues:    Left Voice Message

## 2023-03-27 DIAGNOSIS — F4322 Adjustment disorder with anxiety: Secondary | ICD-10-CM | POA: Diagnosis not present

## 2023-05-15 DIAGNOSIS — M545 Low back pain, unspecified: Secondary | ICD-10-CM | POA: Diagnosis not present

## 2023-05-15 DIAGNOSIS — K047 Periapical abscess without sinus: Secondary | ICD-10-CM | POA: Diagnosis not present

## 2023-07-05 DIAGNOSIS — D649 Anemia, unspecified: Secondary | ICD-10-CM | POA: Diagnosis not present

## 2023-07-05 DIAGNOSIS — E559 Vitamin D deficiency, unspecified: Secondary | ICD-10-CM | POA: Diagnosis not present

## 2023-07-05 DIAGNOSIS — E538 Deficiency of other specified B group vitamins: Secondary | ICD-10-CM | POA: Diagnosis not present

## 2023-07-05 DIAGNOSIS — R7303 Prediabetes: Secondary | ICD-10-CM | POA: Diagnosis not present

## 2023-07-05 DIAGNOSIS — E7800 Pure hypercholesterolemia, unspecified: Secondary | ICD-10-CM | POA: Diagnosis not present

## 2024-07-26 ENCOUNTER — Encounter (HOSPITAL_COMMUNITY): Payer: Self-pay | Admitting: *Deleted
